# Patient Record
Sex: Female | Born: 1940 | Race: White | Smoking: Current every day smoker
Health system: Western US, Academic
[De-identification: ages and names within clinical notes are randomized; demographics above are authoritative.]

## PROBLEM LIST (undated history)

## (undated) MED ORDER — DEXAMETHASONE SOD PHOSPHATE PF 10 MG/ML IJ SOLN
1.00 mg | Freq: Once | INTRAMUSCULAR | Status: AC
Start: 2016-02-11 — End: 2016-02-11

## (undated) MED ORDER — DEXAMETHASONE SOD PHOSPHATE PF 10 MG/ML IJ SOLN
1.00 mg | Freq: Once | INTRAMUSCULAR | Status: AC
Start: 2016-02-17 — End: 2016-02-17

## (undated) MED ORDER — IOHEXOL 240 MG/ML IJ SOLN
1.00 mL | Freq: Once | INTRAMUSCULAR | Status: AC
Start: 2016-02-17 — End: 2016-02-17

## (undated) MED ORDER — IOHEXOL 240 MG/ML IJ SOLN
1.00 mL | Freq: Once | INTRAMUSCULAR | Status: AC
Start: 2016-02-11 — End: 2016-02-11

---

## 2004-01-22 ENCOUNTER — Ambulatory Visit (HOSPITAL_BASED_OUTPATIENT_CLINIC_OR_DEPARTMENT_OTHER)

## 2004-02-01 NOTE — Progress Notes (Signed)
 CLINIC: NEUROSURGERY      REPORT TYPE: CONSULT      Dictating Practitioner: Wilburt Finlay, M.D.    DATE OF SERVICE: 01/22/2004    REASON FOR VISIT: COMPREHENSIVE NEW VISIT    HISTORY OF PRESENT ILLNESS: [name] is a 64 year old woman who is seen  in initial consultation because of difficulties with her neck and low back.  The problems with the neck have been longstanding, and she has a scan from  2000 which demonstrates disk degenerative with root compression, left  greater than right at C4-C5 and C6-C7. Unfortunately, the patient was  given the wrong set of films for the 2005 study and brought with her a  study of both knees, which show osteoarthritis and some hand studies that  show minimal changes. The patient also has difficulty with pain at the  elbow, related to fibromyalgia, and has low back pain with minimal  radiation into the legs, which may be related in part to her splinting of  her neck, as she sits with her head cocked slightly to the left. The  patient's general health has been good, and she has been very active. She  stopped smoking 8 months ago and does drink minimally socially. Her weight  today at the time of visit was 149 pounds. Her blood pressure is 122/71.  She has been married on 2 occasions and changed her name. Her present name  is Melinda Gallagher, and she was accompanied by her present female companion, who  is not her husband. She graded her pain in the neck as 8 out of 10 and is  particularly bothered by numbness in both hands, perhaps slightly worse on  the left, because she is left-handed and finds that she uses that hand  more. She also notes that she is dropping things regularly.    PHYSICAL EXAMINATION:  She was alert and awake and answered all questions appropriately. The  cranial nerves are intact. Neck flexion is limited to approximately 50  degrees, extension to 15 degrees. Rotation to the right is approximately  45 to 50 degrees, to the left no more than 35 degrees. She is  tender over  the lower portion of the cervical spine. Strength in the upper extremities  is generally mildly reduced, left greater than right. There is mild  atrophy of the triceps and biceps on the left. I would grade her strength  at the deltoid at 5/5, at the biceps is 4/5 on the left and 4+/5 on the  right. The triceps is no more than 4-/5 on the left and thought to be  normal on the right. Pronation is 3+/5 on the left and normal on the  right, and supination I thought was also mildly diminished on the left, but  normal on the right. The lower extremity strength is normal. Her gait is  normal, but she does walk with her head cocked to the left. The abdomen is  minimally obese but nontender, and there is no organomegaly.    IMPRESSION AND PLAN: This woman has a number of problems, but the most  severe is cervical spondylosis. The degree of disease is probably advanced  over the last 5 years, but she will need an anterior approach, given the  fact that she has substantial pain under the shoulder blades, which is one  of her major complaints. She also has some pain between the shoulder  blades, but there were minimal changes at C5-C6, and I think most of  this  is radiating from C6-C7. The pain over the top of her shoulders is  associated with the changes at C4-C5 on the scan from 2000. I have  scheduled her for a visit with Amalia Hailey, our nurse practitioner,  and at that time she will bring her new scan. She is going to need either  a 2 or 3-level anterior procedure, and I will get that scheduled towards  the end of February 2006.      Job #: [id number]          Signature Derived From Controlled Access Password  Wilburt Finlay, M.D. 02/01/2004 07:38          DD: 01/22/2004 DT: 01/22/2004 3:20 P DocNo.: [id number]  [id number]      Primary Care Physician:  NONE     PCP_Address2      cc:

## 2004-02-02 ENCOUNTER — Other Ambulatory Visit (INDEPENDENT_AMBULATORY_CARE_PROVIDER_SITE_OTHER): Payer: Self-pay | Admitting: Neurological Surgery

## 2004-02-02 ENCOUNTER — Encounter (INDEPENDENT_AMBULATORY_CARE_PROVIDER_SITE_OTHER): Payer: Self-pay | Admitting: Family

## 2004-02-02 ENCOUNTER — Other Ambulatory Visit (INDEPENDENT_AMBULATORY_CARE_PROVIDER_SITE_OTHER): Payer: Self-pay

## 2004-02-09 NOTE — H&P (Signed)
 Dictating Practitioner: Koren Bound, N.P.        Staff Physician: Wilburt Finlay, M.D.      Date of Admission: 02/15/2004      HISTORY AND PHYSICAL    The patient is a 64 year old female. She is on SSI for her neck and back  degenerative disease. Her attending physician's name is Dr. Landry Dyke of Neurosurgery.    Chief complaint at this time is neck pain. She is preoperative for an  anterior cervical diskectomy at C4-5 and C6-7 and possibly at C5-6 with  allograft fusions and plating. The patient has a history of neck pain  which has been present for a number of years but gradually gotten worse.  It is aggravated by extension and rotation. Her rotation is limited  markedly because of neck pain. She has a history of a very heavy object  falling on her head approximately 10 years ago. She has not had any prior  neck surgeries. She also notes that her hands feel numb most of the time  with a tingling, painful type numbness and she has also developed grip  weakness as well as some mild decrease in her balance, no changes in her  bowel and bladder function. She does have a history of constipation. She  states also she was recently diagnosed with fibromyalgia.    ALLERGIES: BEE STINGS AND CODEINE; CODEINE RESULTS IN GI DISTRESS.    PRIOR SURGERIES: Childbirth times three; she had a right inguinal hernia  repair at age 32, a left wrist ganglion resected, TAH and BSO for ovarian  cancer in 1979 and a laparoscopic cholecystectomy several years ago.    PAST MEDICAL HISTORY: Significant for history of constipation, history of  gastroesophageal reflux disease, depression and asthma as well as most  recently hypothyroidism.    CURRENT MEDICATIONS: Prevacid 30 mg, 1 every morning; Singular 10 mg, 1  every evening; Lipitor 10 mg, 1 every morning; Effexor XR 37.5 mg, 1 every  morning; Dulcolax 100 mg, 1 to 2 daily for constipation; gabapentin 300 mg,  1 at bedtime; flurazepam 15 mg, 1 to 2 at bedtime;  Levoxyl 100 mcg, 1 every  morning; inhalers - Pulmicort two puffs twice a day and albuterol two puffs  twice a day.    TRANSFUSION HISTORY: Negative.    SOCIAL HISTORY: Married, has three children, one child with diabetes  mellitus. She follows a regular diet, drinks one glass of wine daily, she  quit smoking nine months ago.    FAMILY HISTORY: Mother died of emphysema. Her father died of  alcohol-related complications.    SYSTEM REVIEW: Patient has no known history of cardiac disease or MI. She  does have a history of asthma which is controlled at the present time; she  has had no recent respiratory infections. GI: Significant for a history  of ulcer in her 20s, no recent abdominal pain or melena. She had a  screening colonoscopy several years ago that was negative. She has no  known history of renal disease, no history of hematologic disorders, no  history of diabetes mellitus. She was recently diagnosed as having  hypothyroidism and was started on Synthroid. Peripheral Vascular: No  history of DVT. Musculoskeletal: She has osteoarthritis of her knees.  Neurologic: Brief episode of loss of vision in the 1980s, unclear  etiology. She has a history of depression. Reproductive: See the past  surgical history.    PHYSICAL EXAMINATION: Vital Signs: Pulse is 74 and regular, respiratory  rate 14, height is 5 feet, 3 inches, weight is 150 pounds. General: The  patient is a well-developed, well-nourished female who is able to ambulate  without assistance. Skin: No rashes. HEENT: Pupils are equal, round and  reactive to light. Her pharynx is clear. Neck: Decreased range of motion  secondary to pain. Thyroid: No masses. No cervical lymphadenopathy.  Chest and Lungs: Clear without wheezes, rales or rhonchi. Heart: Her  heart rate is regular without murmurs, no S3 or S4. Peripheral Vascular:  Within normal limits. I did not hear carotid bruits. She has palpable  pulses throughout. Abdomen: Soft, no  hepatosplenomegaly.  Musculoskeletal: No peripheral clubbing, cyanosis or edema, crepitus  around the knees with range of motion. Neurologic: Significant for mild  upper extremity weakness at the deltoids, 5- over 5, and grip strength 5-  over 5 and decreased sensation to light touch in the hands and fingers  bilaterally. Her DTRs are slightly increased at the knees but normal at  the ankles and her toes are downgoing. Genital and Pelvic: Has been done  by her primary care physician.    INITIAL LABORATORY STUDIES: Will obtain a CBC, PT/PTT, chem-7 and TSH.  Her EKG shows normal sinus rhythm.    ASSESSMENT AND PLAN: The patient understands to avoid aspirin and  nonsteroidal anti-inflammatories prior to surgery. She will bring her MRI  with her to surgery. Surgical consents are signed. No blood is  anticipated for her surgery.                      Reviewed & Electronically Signed  Koren Bound, N.P. 02/05/2004 16:32  Signature Derived From Controlled Access Password  Wilburt Finlay, M.D. 02/09/2004 09:50    DD: 02/02/2004 DT: 02/02/2004 2:58 P DocNo.: [id number]  [id number]        cc:

## 2004-02-11 NOTE — ECG/Echo (Signed)
Normal sinus rhythm  Normal ECG  No previous tracing on file for serial comparison

## 2004-02-12 ENCOUNTER — Other Ambulatory Visit: Payer: Self-pay

## 2004-02-15 ENCOUNTER — Other Ambulatory Visit (INDEPENDENT_AMBULATORY_CARE_PROVIDER_SITE_OTHER): Payer: Self-pay | Admitting: Neurological Surgery

## 2004-02-15 ENCOUNTER — Inpatient Hospital Stay (HOSPITAL_COMMUNITY): Admitting: Neurological Surgery

## 2004-02-15 LAB — APTT, BLOOD
PTT, Control: 26.2 s
PTT, Control: 26.2 s
PTT: 25.1 s (ref 25.0–33.0)
PTT: 23.4 s — ABNORMAL LOW (ref 25.0–33.0)

## 2004-02-15 LAB — COMPREHENSIVE METABOLIC PANEL, BLOOD
ALT (SGPT): 108 IU/L — ABNORMAL HIGH (ref 10–45)
ALT (SGPT): 220 IU/L — ABNORMAL HIGH (ref 10–45)
AST (SGOT): 251 IU/L — ABNORMAL HIGH (ref 10–45)
AST (SGOT): 332 IU/L — ABNORMAL HIGH (ref 10–45)
Albumin: 3.2 gm/dL — ABNORMAL LOW (ref 3.3–5.0)
Albumin: 3.4 gm/dL (ref 3.3–5.0)
Alkaline Phos: 123 IU/L (ref 30–130)
Alkaline Phos: 153 IU/L — ABNORMAL HIGH (ref 30–130)
BUN: 16 mg/dL (ref 8–18)
BUN: 14 mg/dL (ref 8–18)
Bicarbonate: 26 mEq/L (ref 24–31)
Bicarbonate: 24 mEq/L (ref 24–31)
Bilirubin, Tot: 0.7 mg/dL (ref <1.2)
Bilirubin, Tot: 0.9 mg/dL (ref <1.2)
Calcium: 8.6 mg/dL — ABNORMAL LOW (ref 8.8–10.3)
Calcium: 8.6 mg/dL — ABNORMAL LOW (ref 8.8–10.3)
Chloride: 102 mEq/L (ref 97–107)
Chloride: 102 mEq/L (ref 97–107)
Creatinine: 0.8 mg/dL (ref 0.5–1.5)
Creatinine: 0.8 mg/dL (ref 0.5–1.5)
Glucose: 149 mg/dL — ABNORMAL HIGH (ref 65–110)
Glucose: 178 mg/dL — ABNORMAL HIGH (ref 65–110)
Potassium: 4.1 mEq/L (ref 3.5–5.0)
Potassium: 4.6 mEq/L (ref 3.5–5.0)
Sodium: 137 mEq/L (ref 135–145)
Sodium: 137 mEq/L (ref 135–145)
Total Protein: 5.5 gm/dL — ABNORMAL LOW (ref 6.0–8.0)
Total Protein: 5.9 gm/dL — ABNORMAL LOW (ref 6.0–8.0)

## 2004-02-15 LAB — PROTHROMBIN TIME, BLOOD
INR: 1
INR: 1
Protime, Control: 9.7 s
Protime, Control: 9.7 s
Protime: 10.1 s (ref 9–12)
Protime: 10 s (ref 9–12)

## 2004-02-15 LAB — HEMOGRAM, BLOOD
Hct: 29.3 % — ABNORMAL LOW (ref 36.0–46.0)
Hgb: 10.5 gm/dL — ABNORMAL LOW (ref 12.0–16.0)
MCH: 32.1 pg — ABNORMAL HIGH (ref 27–31)
MCHC: 36 % (ref 32–37)
MCV: 89.3 um3 (ref 82.0–98.0)
MPV: 7.3 fl — ABNORMAL LOW (ref 7.4–10.4)
Plt Count: 288 10*3/uL (ref 130–400)
RBC: 3.29 10*6/uL — ABNORMAL LOW (ref 4.00–5.00)
RDW: 12.7 % (ref 10–15)
WBC: 10.1 10*3/uL (ref 4.0–11.0)

## 2004-02-15 LAB — MAG/PHOS, BLOOD
Magnesium: 1.8 mg/dL (ref 1.8–2.5)
Phosphorous: 5.2 mg/dL — ABNORMAL HIGH (ref 2.5–4.5)

## 2004-02-16 NOTE — Interdisciplinary (Signed)
PERFORMED ON - 02/16/2004 08:45:00;   DONE BY - BAZMJOW, JAMILA;  PROCEDURE - AERO TRT-MDI/SPACER;   PROTOCOL DRIVEN: PENDING;   MDI-LEARNING NEED: CORRECT USE OF MDI/SPACER/PF;   MEDICATION #1: ALBUTEROL;   MED #1 AMOUNT: 2.0;   MED #1 UNIT: PUFFS;   MEDICATION #2: PULMICORT;   MED #2 AMOUNT: 2.0;   MED #2 UNIT: PUFFS;   REASON NOT STARTED: PT SELF-ADMINISTERED;   ADVERSE REACTIONS: NONE;

## 2004-02-16 NOTE — Interdisciplinary (Signed)
PERFORMED ON - 02/16/2004 09:00:00;   DONE BY - BAZMJOW, JAMILA;  PROCEDURE - PDP EVALUATION;   ORDERING PHYSICIAN: BERTA;   ADMITTING DIAGNOSIS: CERVICAL SPONDYLOSIS;   MD REQUEST #1: MDI;   REASON NOT STARTED: {SEE COMMENTS};   ADVERSE REACTIONS: NONE;   COMMENT: MD/NEUROLOGY AGAIN ATT.  COMMENT: APPROPRIATE ORDERS TO PLACE PT INTO MDI PROTOCOL.WILL ATTEMPT TO   PAGECOMMENT: UNABLE TO COMPLETE INITIAL PDP DUE TO MD NOT RESPONDING AND   WRITING

## 2004-02-16 NOTE — Interdisciplinary (Signed)
PERFORMED ON - 02/16/2004 08:50:00;   DONE BY - BAZMJOW, JAMILA;  PROCEDURE - OXYGEN-LOW FLOW;   PROTOCOL DRIVEN: PENDING;   NEURO STATUS: AWAKE;   O2 DEVICE: CANNULA-NASAL;   LITERS/MIN: 3;   SKIN COLOR: NORMAL;   SATURATION: 93;   O2 LOW-ACTIV OUTCME: CONTINUE PRESENT REGIMEN;   ADVERSE REACTIONS: NONE;

## 2004-02-16 NOTE — Interdisciplinary (Signed)
PERFORMED ON - 02/16/2004 14:45:00;   DONE BY - BAZMJOW, JAMILA;  PROCEDURE - PDP EVALUATION;   ORDERING PHYSICIAN: BERTA;   ADMITTING DIAGNOSIS: CERVICAL SPONDYLOSIS;   NEURO STATUS: AWAKE;   CODE STATUS CHECKED: YES;   HEARTRATE: 92;   RESP RATE: 17;   CLUBBING PRESENT: NO;   TRACHEOSTOMY PRESENT: NO;   SOB: NO;   BREATH SOUNDS: DIM BS BILAT;   CHEST EXCURSION: SYMMETRICAL;   COUGH EFFORT: DRY UNPRODUCTIVE;   SPUTUM PRODUCTION: NONE;   PRE-TITR FIO2 OR L/M: 2;   PRE-TITR SAO2: 88;   TITRATED FIO2 OR L/M: 3;   POST TITRATION SAO2: 92;   MD REQUEST #1: MDI;   RCP RECOMMENDATION: #1: MDI PT SELF ADM/TRANSFER TO RN SERVICES;   INDICATION: #1: B:TO INCREASED AERATION;   EVALUATION OUTCOME: #1: AS RCP RECOMMENDED;   MD REQUEST #2: O2 PROT;   RCP RECOMMENDATION: #2: O2 PROTOCOL;   INDICATION: #2: O: TO KEEP SA02 >92;   EVALUATION OUTCOME: #2: AS RCP RECOMMENDED;   ADVERSE REACTIONS: NONE;

## 2004-02-17 NOTE — Interdisciplinary (Signed)
PERFORMED ON - 02/17/2004 08:30:00;   DONE BY - RHODES, SANDY;  PROCEDURE - OXYGEN-LOW FLOW;   PROTOCOL DRIVEN: YES-RCP INITIATED;   NEURO STATUS: ALERT AND ORIENTED;   ROOM AIR SAT: 94;   O2 LOW-ACTIV OUTCME: CONTINUE PRESENT REGIMEN;   ADVERSE REACTIONS: NONE;

## 2004-02-17 NOTE — Interdisciplinary (Signed)
PERFORMED ON - 02/17/2004 08:58:00;   DONE BY - RHODES, SANDY;  PROCEDURE - PDP RE-EVALUATION;   RESP DIAGNOSIS: EMPHYSEMA;   ADMITTING DIAGNOSIS: CERVICAL SPONDYLOSIS, ANTERIOR CERVICAL ;   NEURO STATUS: ALERT AND ORIENTED;   CODE STATUS: FULL CODE/FULL CARE;   HEARTRATE: 82;   RESP RATE: 14;   HEMOGLOBIN: 10.5;   CLUBBING PRESENT: NO;   TRACHEOSTOMY PRESENT: NO;   SOB: NO;   BREATH SOUNDS: DIM BILAT;   CHEST EXCURSION: SYMMETRICAL;   COUGH EFFORT: FAIR;   SPUTUM PRODUCTION: 1 TO ;   SPUTUM COLOR: BROWN;   SPUTUM CONSISTANCY: THICK;   PRE-TITR FIO2 OR L/M: 0.21;   PRE-TITR SAO2: 94;   MD REQUEST #1: O2;   RCP RECOMMENDATION: #1: O2 PROTOCOL;   INDICATION: #1: O: TO KEEP SA02 >92;   EVALUATION OUTCOME: #1: AS RCP RECOMMENDED;   ADVERSE REACTIONS: NONE;

## 2004-02-24 NOTE — Lab (Signed)
 FINAL PATHOLOGIC DIAGNOSIS:  A: Disc 4-5, 5-6, 6-7   -Intervertebral disc material.     COMMENT:  H&E stained sections of the entirely submitted specimen show abundant  intervertebral disc material with minute foci of edge neovascularization.  SPECIMEN(S) SUBMITTED: A: disc 4-5, 5-6, 6-7  CLINICAL HISTORY: Cervical spondylosis.  GROSS DESCRIPTION: A: The specimen (received in formalin, labeled with the  patient's name, medical record number and "02/15/04, OR7, 1, disc 4-5, 5-6,  6-7") consists of numerous gray and tan irregularly shaped soft tissue  measuring approximately 1.3 x 1.0 x 0.4 cm. The entire specimen is  submitted in cassette A1 for formalin fixed, paraffin embedded sections.  ZP/jb  CONFIDENTIAL HEALTH INFORMATION: Health Care information is personal and  sensitive information. If it is being faxed to you it is done so under  appropriate authorization from the patient or under circumstances that do  not require patient authorization. You, the recipient, are obligated to  maintain it in a safe, secure and confidential manner. Re-disclosure  without additional patient consent or as permitted by law is prohibited.  Unauthorized re-disclosure or failure to maintain confidentiality could  subject you to penalties described in federal and state law. If you have  received this report or facsimile in error, please notify the [facility] immediately and destroy the received document(s).   Material reviewed and Interpreted and  Report Electronically Signed by:  Regis Bill. Hansen M.D. [phone])  Attending Neuropathologist  02/24/04 17:44  Electronic Signature derived from a single  controlled access password

## 2004-02-25 NOTE — Op Note (Signed)
 Dictating Practitioner: Wilburt Finlay, M.D.    Staff Physician: Wilburt Finlay, M.D.    Date of Operation: 02/15/2004          PREOPERATIVE DIAGNOSIS: Cervical spondylosis with cervical stenosis from  C4-5 to C6-7.  POSTOPERATIVE DIAGNOSIS: Cervical spondylosis with cervical stenosis from  C4-5 to C6-7.  OPERATIONS: 1. Anterior cervical diskectomies at C4-5 and C5-6 using  operating microscope. 2. Partial vertebrectomy of C6 and diskectomy at  C6-7 using operating microscope. 3. Placement of allograft fibular  fusion, 8-mm graft at C6-7, 7-mm graft at C5-6 and 6-mm graft at C4-5. 4.  Placement of 51-mm, low-profile, eight-hole plate using four 14-mm screws  at top two levels and 16-mm self-drilling, self-tapping screws at bottom  two levels with locking screws.  ATTENDING SURGEON: Estill Batten ASST: S. Berta    ESTIMATED BLOOD LOSS: 25 mL.  DRAINS LEFT: One.    NOTE: I performed the procedure with Dr. Verdene Lennert assistance, who was  present throughout.    OPERATIVE SUMMARY: This woman has cervical spondylosis with both neck pain  as well as arm pain and pain about the shoulders. Accordingly, after an  MRI scan demonstrated compression at multiple levels and cervical stenosis,  she was brought to the operating room and intubated asleep, but with a  fiberoptic technique. The neck was placed in minimal extension. The field  was prepared and draped in the usual fashion.    A longitudinal incision was made extending from the bottom of C3 down to  C7. Dissection was carried medial to the carotid sheath and  sternocleidomastoid muscle. We identified some large veins, which we were  able to retract out of the way, and then opened the prevertebral soft  tissue fascia after using a Kitner to get better exposure. We then pushed  the longus colli muscles laterally using a key elevator and then the Bovie.  A needle was placed in C4-5, which confirmed the level. We then completed  exposing C4 through C7 and placed two  sets of Trimline retractor blades.    Then, using the Midas Rex drill, which we used to remove parts of the  overlying lips of C4, C5 and C6, we then used a blade to excise a portion  of the disk, but there was not very much disk material at any of the three  levels. We then brought in the operating microscope and began at C6-7,  where we drilled out a significant amount of bony spurring as well as some  disk material at the bottom. The posterior longitudinal ligament was  exposed, opened using the [employer] and then [employer] out using 1- and  2-mm Kerrisons. Bleeding was controlled with a small bit of Surgicel along  the root on the right side at C6-7.    We then placed Caspar pins to get better distraction at C5-6. We drilled  this out under the microscope as well. Here, the posterior longitudinal  ligament was again opened using the eBay. We carried the dissection  laterally using 1- and 2-mm Kerrisons. Bleeding was controlled with the  bipolar. A 7-mm graft was then placed under distraction. We then went  back to the C6-7 level, having gotten C5-6 decompressed, and using a drill,  enlarged the space considerably to get exposure of the remaining osteophyte  at this level, which was then drilled out. Thus, a partial vertebrectomy  had to be carried out.    An 8-mm lordotic graft was then tapped into  place under distraction. We  then placed distraction pins at C4, having had a pin placed at C5,  distracted this disk space and under the microscope, cleaned this out as  well using the Midas Rex drill, a small angled-up curet and then the Rosen  knife to open the posterior longitudinal ligament, which was rongeured away  using a 1-mm Kerrison. Bleeding was controlled with the bipolar and was  minimal. A 6-mm graft was then tapped into place here without difficulty.  The pins were then all removed and the holes waxed. A Penrose drain was  placed through a separate stab wound.    A 51-mm plate was then  selected and placed over C4 through C7. It was bent  minimally at the bottom end in order to get a good fixation. Then, using  the awl, we placed successive holes at each of the levels. We used four  14-mm screws above and four 16-mm screws below. Locking screws were then  placed. The wound was copiously irrigated. Bleeding was controlled with  the bipolar. The wound was then closed in layers with Dexon and Monocryl  suture on the skin along with tissue glue.                          Signature Derived From Controlled Access Password  Wilburt Finlay, M.D. 02/25/2004 17:54    DD: 02/15/2004 DT: 02/16/2004 3:02 P DocNo.: [id number]  [id number].[facility]    Referring Physician:  Loreli Slot        Primary Care Physician:  Loreli Slot        cc:

## 2004-02-27 NOTE — Op Note (Signed)
 Dictating Practitioner: Verna Czech. Berton Lan, M.D.    Staff Physician: Wilburt Finlay, M.D.    Date of Operation: 02/15/2004          PREOPERATIVE DIAGNOSIS: Cervical spinal stenosis with degenerative disk  disease.  POSTOPERATIVE DIAGNOSIS: Cervical spinal stenosis with degenerative disc  disease.  OPERATION: Anterior cervical diskectomy and fusion using Synthes plating  system and screws with allograft at C4-5, C5-6 and C6-7 levels.  SURGEON/STAFF: Estill Batten ASSTVerdene Lennert    ANESTHESIA: General endotracheal tube anesthesia.    DETAILS OF CONSENT: The patient was made aware of all risks and benefits  of the procedure and all questions were answered to her satisfaction.    DETAILS OF THE PROCEDURE: The patient was brought back to the operating  room, intubated and sedated. One gram of Ancef was given. The patient was  placed in a supine fashion with a bump placed beneath her shoulder blades.  The patient's left side of the neck was prepped and draped in a surgical  fashion.    An approximately 4 to 5 mm incision was made in a longitudinal direction  anterior to the sternocleidomastoid muscle on the left side. This was done  using a #10 blade scalpel after administration of local anesthetic.  Further tissue dissection was accomplished using Bovie electrocautery over  the superficial layers. Further tissue dissection was then accomplished  using Metzenbaum scissors. Key structures were identified, such as the  carotid sheath the esophagus and trachea, and were avoided. A plane was  dissected longitudinally, anterior to the sternocleidomastoid muscle, down  to the level of the cervical spine. The anterior aspect of the vertebral  bodies was identified and the anterior longitudinal ligament was dissected  to allow the Bovie to be used under the longus coli muscles to sweep down  laterally in order to facilitate placement of self-retaining retractor  system.    A localizing needle was placed at this time and an  intraoperative lateral  cervical spine x-ray was obtained to confirm proper level. The needle was  demonstrated to be in the cervical 4-5 region interdisk space. Next, the  operating microscope was brought in, in order to further define the  microanatomy of the structures unable to be identified using the naked eye  loupe magnification. A #11 blade scalpel was used to box-cut out the disk  material from C4-5, C5-6 and C6-7. Caspar pins were placed into the  vertebral bodies in order to allow for distraction. Further disk material  was removed using a combination of curets as well as Kerrison rongeur, in  addition to the Midas Rex drill. Each disk space was cleared down to the  level of the posterior longitudinal ligament, at which time a Rosen knife  was used to define a plane and a combination of #1 and #2 Kerrisons was  used to transect the posterior longitudinal ligament out to the level of  the foramina, through which bilateral foraminotomies were also performed at  all three levels.    Once it was felt that adequate decompression was achieved, allograft was  then placed into the three intervertebral spaces after trialing devices  were used. The allograft was placed into C4-5, C5-6 and C6-7 and were  tamped in place using a mallet and tamping device. At this time an  anterior cervical plate, which was the Synthes plate, was used to cover  from the C4 vertebral body down to the C7 vertebral body. Then 14 mm  screws were placed  in the C4 vertebral body bilaterally as well as the C5  vertebral body bilaterally, and then 16 mm screws were placed at the C6  vertebral body and the C7 vertebral body bilaterally at converging angles  to increase the pull-out strength. Locking screws were then put in place  in all of the screws.    Meticulous hemostasis was achieved and copious irrigation was used. A  Penrose drain was placed and the incision was closed using a running 3-0  Vicryl for the platysma followed by inverted  interrupted 3-0 Vicryl sutures  for the dermal and subdermal layer, followed by a running 4-0 Monocryl for  final skin closure. Indermil was placed on top of the incision for final  skin closure.    The sponge, instrument and needle counts were correct at the end of the  case. Dr. Landry Dyke was scrubbed for the entire operative  procedure. After the procedure was completed, the vocal cords were  assessed using the fiberoptic device for symmetry and were found to be  consistent with normal anatomy.                      Reviewed & Electronically Signed  Vallen Calabrese C. Berton Lan, M.D. 02/24/2004 14:53  Signature Derived From Controlled Access Password  Wilburt Finlay, M.D. 02/27/2004 06:03    DD: 02/15/2004 DT: 02/16/2004 5:22 P DocNo.: [id number]  SCB/r10 [id number].[facility]    Referring Physician:  Loreli Slot        Primary Care Physician:  Loreli Slot        cc:
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
## 2004-03-01 ENCOUNTER — Ambulatory Visit (INDEPENDENT_AMBULATORY_CARE_PROVIDER_SITE_OTHER): Admitting: Family

## 2004-03-01 ENCOUNTER — Other Ambulatory Visit (INDEPENDENT_AMBULATORY_CARE_PROVIDER_SITE_OTHER): Payer: Self-pay | Admitting: Family

## 2004-03-22 ENCOUNTER — Ambulatory Visit (INDEPENDENT_AMBULATORY_CARE_PROVIDER_SITE_OTHER): Admitting: Neurological Surgery

## 2004-05-03 ENCOUNTER — Ambulatory Visit (INDEPENDENT_AMBULATORY_CARE_PROVIDER_SITE_OTHER): Admitting: Neurological Surgery

## 2004-05-03 ENCOUNTER — Encounter (INDEPENDENT_AMBULATORY_CARE_PROVIDER_SITE_OTHER): Admitting: Neurological Surgery

## 2004-06-14 ENCOUNTER — Encounter (INDEPENDENT_AMBULATORY_CARE_PROVIDER_SITE_OTHER): Admitting: Neurological Surgery

## 2004-06-21 ENCOUNTER — Ambulatory Visit (INDEPENDENT_AMBULATORY_CARE_PROVIDER_SITE_OTHER): Admitting: Neurological Surgery

## 2004-06-23 NOTE — Progress Notes (Signed)
CLINIC: NEUROSURGERY      REPORT TYPE: NOTE      Dictating Practitioner: Wilburt Finlay, M.D.      DATE OF SERVICE: 06/21/2004    REASON FOR VISIT: FOLLOWUP    HISTORY: Ms. Goldwasser returns today for her last follow-up visit. She has  had a very nice course with regard to her 3-level anterior cervical  discectomy. Everything is healed and the pain in her left arm is  completely gone. She has some mild limitation of rotation of the neck but  other than that is doing very well. Unfortunately, she has had an episode  of severe lumbar discomfort, primarily on the right side into the buttock  and perhaps some into the leg, which lasted about 2 weeks and from which  she was completely disabled during that time. She is now substantially  better but it is getting very hot in the [facility], so her activities  are somewhat limited. I have told her that next time should she have such  an episode, she should feel free to call me and I will see what I can do  via telephone.      Job #: [id number]          Signature Derived From Controlled Access Password  Wilburt Finlay, M.D. 06/23/2004 09:27      DD: 06/21/2004 DT: 06/21/2004 11:00 A DocNo.: [id number]  [id number]      Primary Care Physician:  UNKNOWN        cc:

## 2016-02-11 ENCOUNTER — Encounter (INDEPENDENT_AMBULATORY_CARE_PROVIDER_SITE_OTHER): Payer: Self-pay | Admitting: Interventional Pain Management

## 2016-02-11 ENCOUNTER — Ambulatory Visit (INDEPENDENT_AMBULATORY_CARE_PROVIDER_SITE_OTHER): Payer: Medicare Other | Admitting: Interventional Pain Management

## 2016-02-11 VITALS — BP 109/65 | HR 69 | Temp 98.3°F | Resp 16

## 2016-02-11 DIAGNOSIS — M542 Cervicalgia: Principal | ICD-10-CM

## 2016-02-11 DIAGNOSIS — M961 Postlaminectomy syndrome, not elsewhere classified: Secondary | ICD-10-CM

## 2016-02-11 DIAGNOSIS — M5416 Radiculopathy, lumbar region: Secondary | ICD-10-CM

## 2016-02-11 DIAGNOSIS — G8929 Other chronic pain: Principal | ICD-10-CM
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
## 2016-02-15 ENCOUNTER — Ambulatory Visit (HOSPITAL_BASED_OUTPATIENT_CLINIC_OR_DEPARTMENT_OTHER): Payer: Medicare Other | Admitting: Physical Medicine & Rehabilitation

## 2016-02-17 ENCOUNTER — Ambulatory Visit
Admission: RE | Admit: 2016-02-17 | Discharge: 2016-02-17 | Disposition: A | Discharge status: Home / Self Care | Payer: Medicare Other | Attending: Interventional Pain Management | Admitting: Interventional Pain Management

## 2016-02-17 ENCOUNTER — Other Ambulatory Visit (HOSPITAL_BASED_OUTPATIENT_CLINIC_OR_DEPARTMENT_OTHER): Payer: Self-pay | Admitting: Interventional Pain Management

## 2016-02-17 DIAGNOSIS — M5416 Radiculopathy, lumbar region: Principal | ICD-10-CM | POA: Insufficient documentation

## 2016-02-17 DIAGNOSIS — R52 Pain, unspecified: Principal | ICD-10-CM | POA: Insufficient documentation

## 2016-02-17 MED ORDER — IOHEXOL 240 MG/ML IJ SOLN
1.0000 mL | Freq: Once | INTRAMUSCULAR | Status: AC
Start: 2016-02-17 — End: 2016-02-17
  Administered 2016-02-17: 240 mg via EPIDURAL
  Filled 2016-02-17 (×2): qty 10

## 2016-02-17 MED ORDER — DEXAMETHASONE SOD PHOSPHATE PF 10 MG/ML IJ SOLN
1.0000 mg | Freq: Once | INTRAMUSCULAR | Status: AC
Start: 2016-02-17 — End: 2016-02-17
  Administered 2016-02-17: 5 mg via EPIDURAL
  Filled 2016-02-17 (×2): qty 1

## 2016-02-17 NOTE — H&P (Signed)
Ambulatory Surgery/Invasive Procedure History and Physical      Primary Care Physician Mariella Saahiermann, Paige Alexandra    Chief Complaint:  low back and leg pain     76 year old female     BP 132/65 (BP Location: Right arm, BP Patient Position: Sitting)   Pulse 75   Temp 98 F (36.7 C) (Oral)   Resp 16   Ht 5\' 3"  (1.6 m)   Wt 53.5 kg (118 lb)   SpO2 97%   BMI 20.9 kg/m2    No past medical history on file.    Allergies   Allergen Reactions    Bee Venom Hives    Codeine Other     Patient has a ulcer        No current outpatient prescriptions on file.     Current Facility-Administered Medications   Medication Dose Route Frequency Provider Last Rate Last Dose    [COMPLETED] dexamethasone (DECADRON) PF injection 1-10 mg  1-10 mg Epidural Once Deboraha Sprangeddy, Rajiv Dwaram, MD   5 mg at 02/17/16 1415    [COMPLETED] iohexol (OMNIPAQUE 240) 240 MG/ML injection 240-2,400 mg  1-10 mL Epidural Once Deboraha Sprangeddy, Rajiv Dwaram, MD   240 mg at 02/17/16 1415       I have reviewed the patient's medications.      Physical Exam       Can this patient make their own healthcare decisions?  Yes    Chest:  Breaths easily     Heart:  RRR    Abdomen:  Soft    Pain Management Needs/Options discussed.    No Advanced Directives.      Resuscitative Status:  Full Code, Full Care    Diagnosis:    ICD-10-CM ICD-9-CM    1. Lumbar radiculopathy M54.16 724.4 dexamethasone (DECADRON) PF injection 1-10 mg      iohexol (OMNIPAQUE 240) 240 MG/ML injection 240-2,400 mg       Procedure: Lumbar epidural steroid injection    The patient has had extended relief with this procedure at the same levels of greater than 50% for greater than 3 months.      Discussed Risks, Benefits, and Alternatives to procedure.  Questions answered.  Patient voiced understanding and wished to proceed. Consent Signed.    See procedure note of same date.

## 2016-02-17 NOTE — Discharge Instructions (Signed)
Patient verbalized understanding of below instructions    Procedure Done Today: Lumbar Epidural Steroid Injection    Follow up with another injection in 2-3 weeks.    Post Procedure Instructions  Continue present medication unless otherwise indicated.  Resume your normal diet after being discharge.  Ice pack to treatment site (no more than 20 minutes at a time) if needed for pain relief and/or muscle spasm.  Avoid strenuous activities and driving until tomorrow morning.  If you have a band-aid dressing, you may remove it tomorrow morning.  Resume normal activities tomorrow morning, unless otherwise directed.  No submersion in water for 24 hrs.  If your next appointment is at the Pain Clinic, then please call [phone] to make an appointment.  If your next appointment is another procedure, then please call [phone] to make an appointment.    Call the [facility],  [phone] and ask for the pain management provider on call for ANY sign of:  Temperature above 101.5 F  Redness or drainage at the treatment site  Severe, uncontrollable headache    Call 911 IN CASE OF EMERGENCY

## 2016-02-17 NOTE — Progress Notes (Signed)
Attending Note:    Subjective:  I reviewed the history.  Patient interviewed and examined.  History of present illness (HPI):  Pain     Review of Systems (ROS): As per the fellow's note.  Past Medical, Family, Social History:  As per the fellow's  note.    Objective:   I have examined the patient and I concur with the fellow's exam.  Assessment and plan reviewed with the fellow and patient. I agree with the fellow's plan as documented and have edited fellow's note as necessary.  See the fellow's note for further details.

## 2016-02-17 NOTE — Procedures (Signed)
Procedure Note, [facility]    Preoperative Diagnosis: Lumbar radiculopathy    Postoperative Diagnosis: Lumbar radiculopathy    Procedure: 1. L5-S1 interlaminar lumbar  epidural steroid injection    2.   Fluoroscopy for needle guidance.  3.  Lumbar epidurography.     Surgeon/Staff: Tollie Pizzaimothy J Ermalinda Joubert, MD    Anesthesia:  Lidocaine 1%  Fluoroscopy Time:   16     seconds                                         Indications: The patient c/o  Axial Low Back Pain and Lower Extremity Pain. The patient's history and physical findings are consistant with Lumbar radiculopathy. They are here for an injection at the site thought to be the source of their pain. The patient has benefited from similar injections in the past.    Procedure in detail:  Written informed consent was obtained.  The chart was reviewed, questions were answered and the patient wished to proceed. The patient had no contraindications to the procedure.  Vital signs were stable. Standard monitoring was applied.  The patient, the procedure nurse, and the physician confirmed the site of injection after an official "time out." The skin over the injection site was also marked and confirmed as the site of the injection.    Localization Time Out:  An additional intraoperative timeout, specifically to confirm accurate localization, was conducted by the attending physician.     The patient remained awake and alert throughout the procedure.  The patient was placed in the prone position.  The skin was prepped with chlorhexidine and sterile drapes were applied. The skin and soft tissues were anesthetized with Lidocaine 1%. A  20 gauge 3.5 inch Touhy needle epidural needle was inserted percutaneously and advanced under fluoroscopic guidance using  AP, and lateral projections. Using loss of resistance to preservative free normal saline and a left sided interlaminar approach, the L5-S1 posterior epidural space was entered after the lamina was contacted with the  epidural needle.  No paraesthesias occurred and aspiration was negative   Epidurography and radiographic interpretation: Epidurography was performed by injection of Omnipaque 240 under live fluoroscopy.  Multiple Xray images were obtained.  Radiologic examination confirmed proper spread of the contrast medium within the epidural space.  There was no evidence of intrathecal or intravascular runoff. AP images obtained and Oblique images obtained..    The needle was injected with dexamethasone (10 mg/mL)  5 mg, Preservative free normal saline 2 mL and Lidocaine (1%)  0.5 mL.  The needle was removed.  A sterile bandage was applied.  The patient did not experience any hemodynamic or neurologic sequelae.  The attending physician was present during all critical points of the procedure.    The patient was transferred to the recovery area. Post operative instructions were explained and given to the  patient. The patient was discharged in stable health and given a procedure clinic appointment.

## 2016-03-08 ENCOUNTER — Other Ambulatory Visit (HOSPITAL_BASED_OUTPATIENT_CLINIC_OR_DEPARTMENT_OTHER): Payer: Self-pay | Admitting: Interventional Pain Management

## 2016-03-08 ENCOUNTER — Ambulatory Visit
Admission: RE | Admit: 2016-03-08 | Discharge: 2016-03-08 | Disposition: A | Discharge status: Home / Self Care | Payer: Medicare Other | Attending: Interventional Pain Management | Admitting: Interventional Pain Management

## 2016-03-08 DIAGNOSIS — M542 Cervicalgia: Secondary | ICD-10-CM

## 2016-03-08 DIAGNOSIS — M545 Low back pain: Secondary | ICD-10-CM | POA: Insufficient documentation

## 2016-03-08 DIAGNOSIS — R52 Pain, unspecified: Principal | ICD-10-CM | POA: Insufficient documentation

## 2016-03-08 DIAGNOSIS — M5416 Radiculopathy, lumbar region: Principal | ICD-10-CM | POA: Insufficient documentation

## 2016-03-08 DIAGNOSIS — G8929 Other chronic pain: Secondary | ICD-10-CM

## 2016-03-08 MED ORDER — IOHEXOL 240 MG/ML IJ SOLN
1.00 mL | Freq: Once | INTRAMUSCULAR | Status: AC
Start: 2016-03-08 — End: 2016-03-08
  Administered 2016-03-08: 240 mg via EPIDURAL
  Filled 2016-03-08 (×2): qty 10

## 2016-03-08 MED ORDER — DEXAMETHASONE SOD PHOSPHATE PF 10 MG/ML IJ SOLN
1.00 mg | Freq: Once | INTRAMUSCULAR | Status: AC
Start: 2016-03-08 — End: 2016-03-08
  Administered 2016-03-08: 5 mg via EPIDURAL
  Filled 2016-03-08 (×2): qty 1

## 2016-03-08 NOTE — H&P (Signed)
Ambulatory Surgery/Invasive Procedure History and Physical      Primary Care Physician Mariella Saahiermann, Paige Alexandra    Chief Complaint:  low back and leg pain     76 year old female     BP 136/85 (BP Location: Right arm, BP Patient Position: Sitting)   Pulse 72   Temp 97.8 F (36.6 C) (Oral)   Resp 16   SpO2 95%    No past medical history on file.    Allergies   Allergen Reactions    Bee Venom Hives    Codeine Other     Patient has a ulcer        No current outpatient prescriptions on file.     Current Facility-Administered Medications   Medication Dose Route Frequency Provider Last Rate Last Dose    dexamethasone (DECADRON) PF injection 1-10 mg  1-10 mg Epidural Once Daelen Belvedere, Tollie Pizzaimothy J, MD        iohexol (OMNIPAQUE 240) 240 MG/ML injection 240-2,400 mg  1-10 mL Epidural Once Ifeanyi Mickelson, Tollie Pizzaimothy J, MD           I have reviewed the past medical history, allergies and current medications as documented in the electronic health record.      Physical Exam       Can this patient make their own healthcare decisions?  Yes    Chest:  Breaths easily     Heart:  RRR    Abdomen:  Soft    Pain Management Needs/Options discussed.    No Advanced Directives.      Resuscitative Status:  Full Code, Full Care    Diagnosis:    ICD-10-CM ICD-9-CM    1. Lumbar radiculopathy M54.16 724.4 dexamethasone (DECADRON) PF injection 1-10 mg      iohexol (OMNIPAQUE 240) 240 MG/ML injection 240-2,400 mg       Procedure: Lumbar epidural steroid injection    The patient has had extended relief with this procedure at the same levels of greater than 50% for greater than 6 weeks.      Discussed Risks, Benefits, and Alternatives to procedure.  Questions answered.  Patient voiced understanding and wished to proceed. Consent Signed.    See procedure note of same date.

## 2016-03-08 NOTE — Procedures (Signed)
Procedure Note, [facility]    Preoperative Diagnosis: Lumbar radiculopathy    Postoperative Diagnosis: Lumbar radiculopathy    Procedure: 1. L5-S1 interlaminar lumbar  epidural steroid injection    2.   Fluoroscopy for needle guidance.  3.  Lumbar epidurography.     Surgeon/Staff: Miachel Rouximothy J Shogo Larkey, MD  Assistant: Arletha PiliSepher Rejai, M.D.    Anesthesia:  Lidocaine 1%  Fluoroscopy Time:   12     seconds                                         Indications: The patient c/o  Axial Low Back Pain and Lower Extremity Pain. The patient's history and physical findings are consistant with Lumbar radiculopathy. They are here for an injection at the site thought to be the source of their pain. The patient has benefited from similar injections in the past.    Procedure in detail:  Written informed consent was obtained.  The chart was reviewed, questions were answered and the patient wished to proceed. The patient had no contraindications to the procedure.  Vital signs were stable. Standard monitoring was applied.  The patient, the procedure nurse, and the physician confirmed the site of injection after an official "time out." The skin over the injection site was also marked and confirmed as the site of the injection.    Localization Time Out:  An additional intraoperative timeout, specifically to confirm accurate localization, was conducted by the attending physician.     The patient remained awake and alert throughout the procedure.  The patient was placed in the prone position.  The skin was prepped with chlorhexidine and sterile drapes were applied. The skin and soft tissues were anesthetized with Lidocaine 1%. A  20 gauge 3.5 inch Touhy needle epidural needle was inserted percutaneously and advanced under fluoroscopic guidance using  AP, and lateral projections. Using loss of resistance to preservative free normal saline and a left sided interlaminar approach, the L5-S1 posterior epidural space was entered after the  lamina was contacted with the epidural needle.  No paraesthesias occurred and aspiration was negative   Epidurography and radiographic interpretation: Epidurography was performed by injection of Omnipaque 240 under live fluoroscopy.  Multiple Xray images were obtained.  Radiologic examination confirmed proper spread of the contrast medium within the epidural space.  There was no evidence of intrathecal or intravascular runoff. AP images obtained and Oblique images obtained..    The needle was injected with dexamethasone (10 mg/mL)  5 mg, Preservative free normal saline 2 mL and Lidocaine (1%)  0.5 mL.  The needle was removed.  A sterile bandage was applied.  The patient did not experience any hemodynamic or neurologic sequelae.  The attending physician was present during all critical points of the procedure.    The patient was transferred to the recovery area. Post operative instructions were explained and given to the  patient. The patient was discharged in stable health and given a follow up appt. with PA  in 3 week(s) .

## 2016-03-08 NOTE — Discharge Instructions (Signed)
Patient verbalized understanding of below instructions    Procedure Done Today: Lumbar epidural steroid injection with an emphasis on the left side    Your next clinic and procedure will be in the new building next door called the [facility]  Please, call to set up a clinic visit for 4-6 weeks from now  Dr. Wynn MaudlinFurnish would like you to call and set up your cervical MRI    Please call [facility] Scheduling at [phone] to schedule your appointment at your earliest convenience. In order to obtain the most accurate insurance approval, we secure authorization after you are registered and scheduled for imaging services. Please have your insurance cards available when scheduling your exam.      Post Procedure Instructions  Continue present medication unless otherwise indicated.  Resume your normal diet after being discharge.  Ice pack to treatment site (no more than 20 minutes at a time) if needed for pain relief and/or muscle spasm.  Avoid strenuous activities and driving until tomorrow morning.  If you have a band-aid dressing, you may remove it tomorrow morning.  Resume normal activities tomorrow morning, unless otherwise directed.  No submersion in water for 24 hrs.  If your next appointment is at the Pain Clinic, then please call [phone] to make an appointment.  If your next appointment is another procedure, then please call [phone] to make an appointment.    Call the [facility],  [phone] and ask for the pain management provider on call for ANY sign of:  Temperature above 101.5 F  Redness or drainage at the treatment site  Severe, uncontrollable headache    Call 911 IN CASE OF EMERGENCY

## 2016-08-02 ENCOUNTER — Telehealth (HOSPITAL_BASED_OUTPATIENT_CLINIC_OR_DEPARTMENT_OTHER): Payer: Self-pay | Admitting: Interventional Pain Management

## 2016-08-02 NOTE — Telephone Encounter (Signed)
Patient is calling regarding the Lumbar ESI she had on 03/08/2016. Patient stated that the pain has returned and that she experienced a significant amount of relief from her last Lumbar ESI. Offered to schedule a follow up visit in clinic but patient declined because she's out of town. She is requesting for the order to be placed and is requesting a call back at [phone]. Per patient, it is OK to leave a message.

## 2016-08-02 NOTE — Telephone Encounter (Signed)
Spoke with patient. Has not been seen for follow up since initial LESI. Instructed that she will need to schedule clinic visit in order to proceed with repeat injection.

## 2019-01-26 ENCOUNTER — Encounter (INDEPENDENT_AMBULATORY_CARE_PROVIDER_SITE_OTHER): Payer: Self-pay

## 2020-10-13 IMAGING — CT CT ABD/PEL WO/W CONT
2 of 5 series · 12 of 46 positions shown, 14 images · non-contrast
Comparison: Lumbar spine x-ray study from same day.

HISTORY: Left lower quadrant pain.
TECHNIQUE: Axial images of the abdomen and pelvis are obtained from the hemidiaphragms to the pubic symphysis prior to and following 100 mL Hsovue-0UU intravenous contrast.  Serum creatinine 0.8 mg/dL. 1 L Gastrografin/water oral contrast given. Coronal and sagittal reformation images obtained. Dose reduction technique used: Automated exposure control and adjustment of the mA and/or kV according to patient size.  CT count in previous 12-months: 0

[Series 2: pre contrast · axial · non-contrast · 0.47mm/px · z∈[+1109,+1459]mm · 9 of 82 slices shown, 11 images]
[im 6/82  soft-tissue]
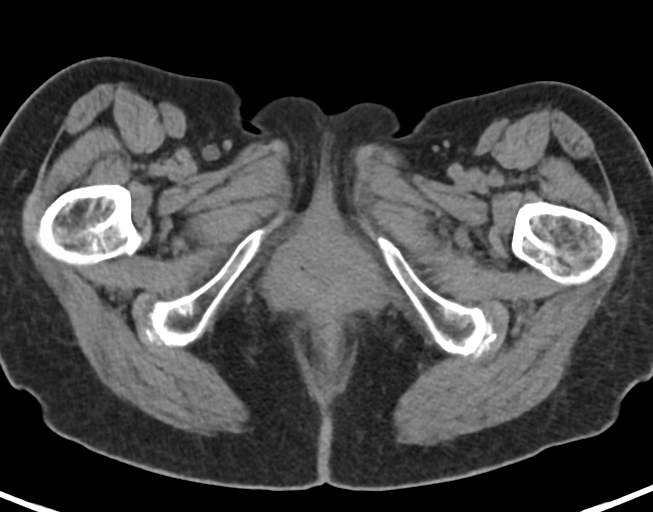
[im 6/82  bone]
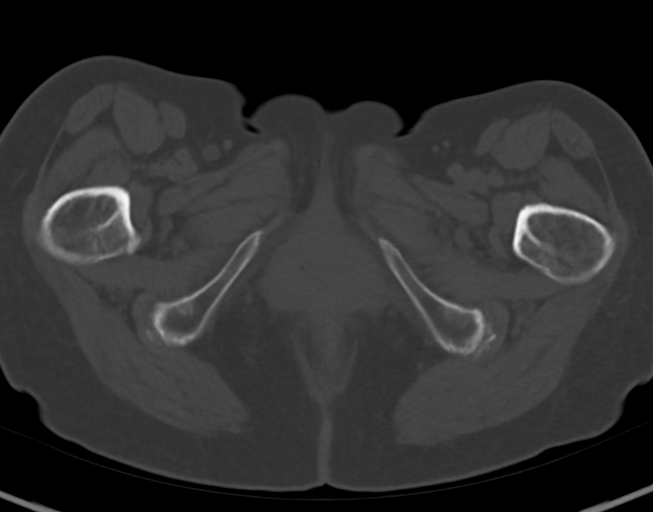
[im 18/82  soft-tissue]
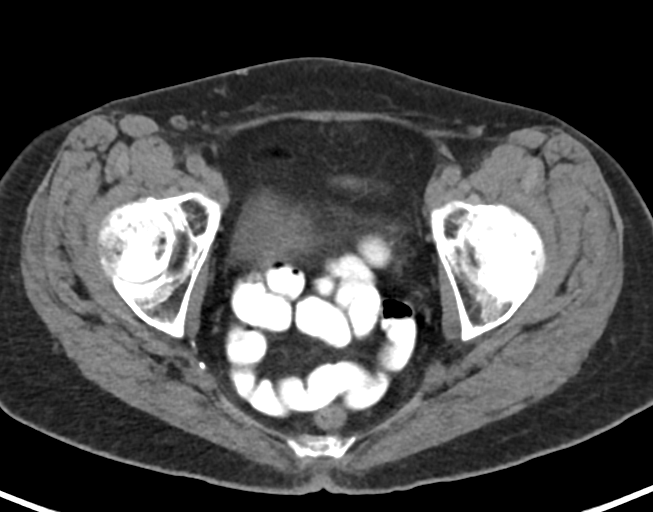
[im 24/82  soft-tissue]
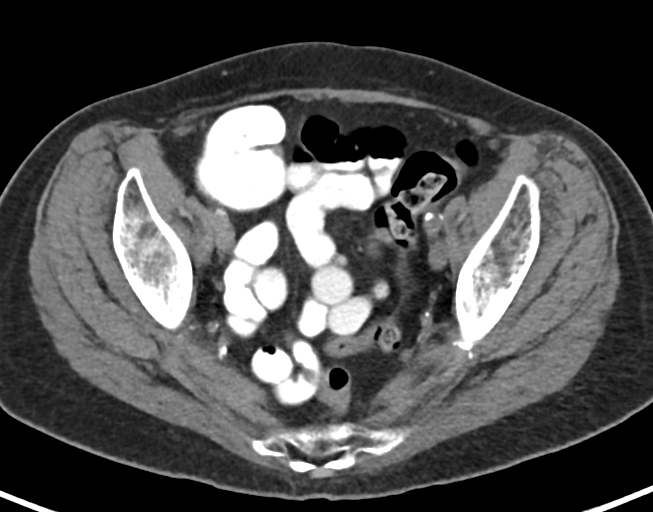
[im 35/82  soft-tissue]
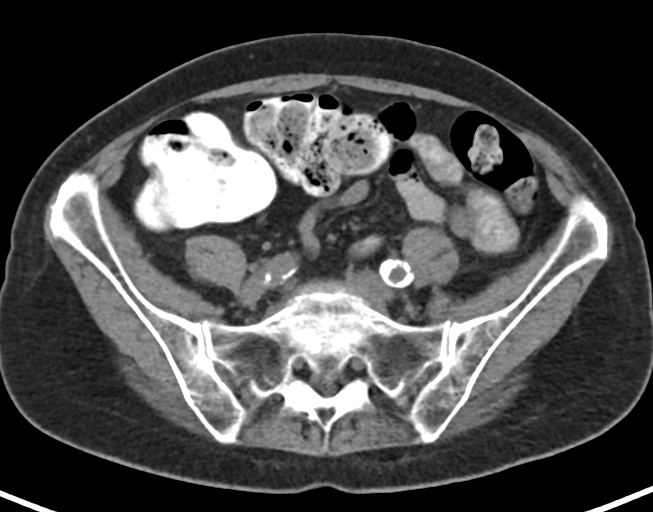
[im 41/82  soft-tissue]
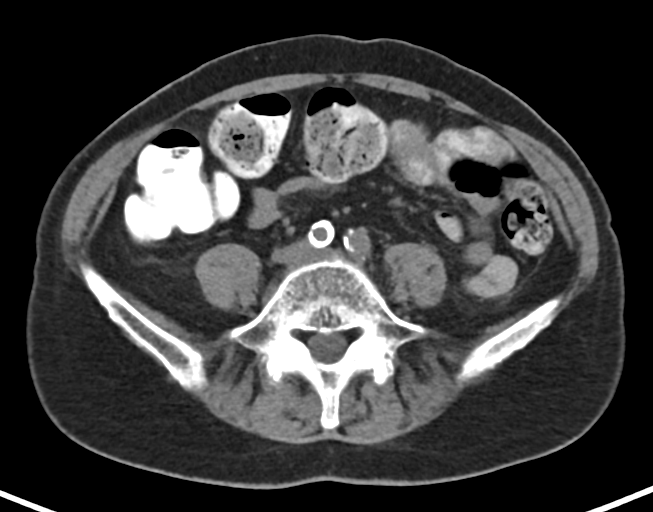
[im 47/82  soft-tissue]
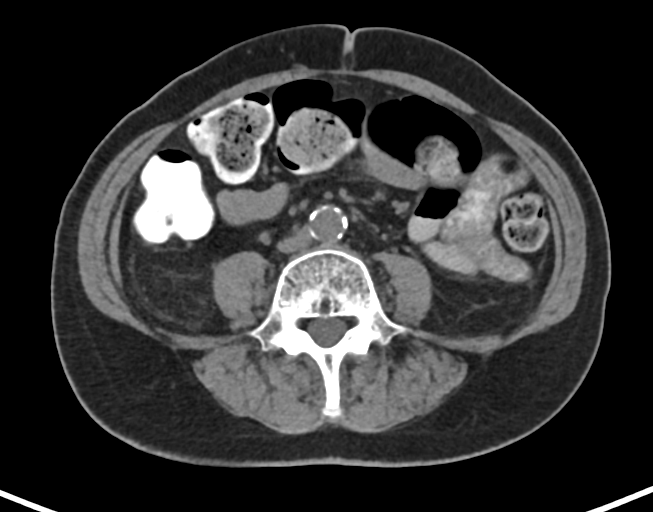
[im 58/82  soft-tissue]
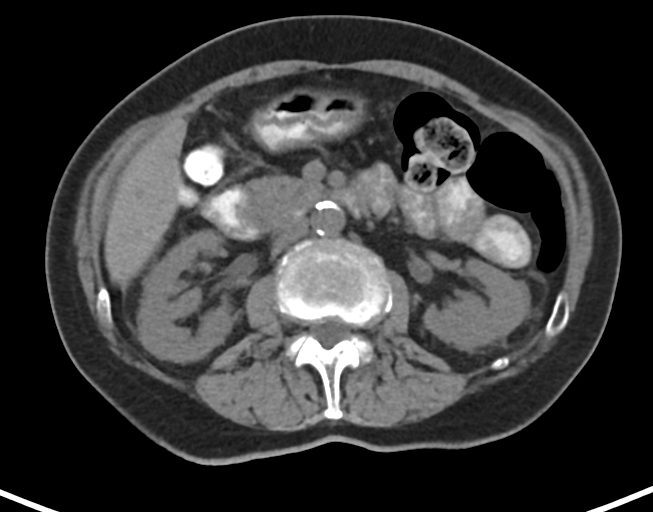
[im 64/82  soft-tissue]
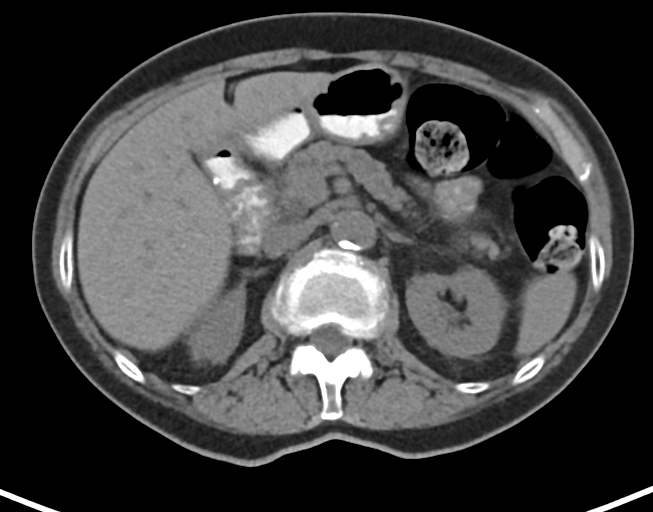
[im 76/82  soft-tissue]
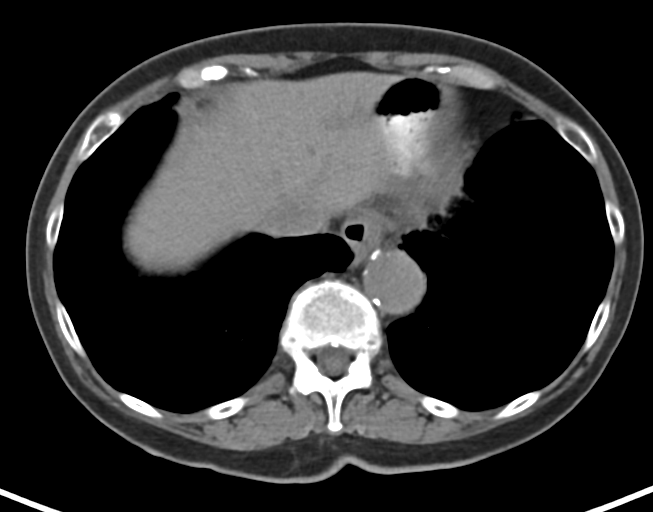
[im 76/82  bone]
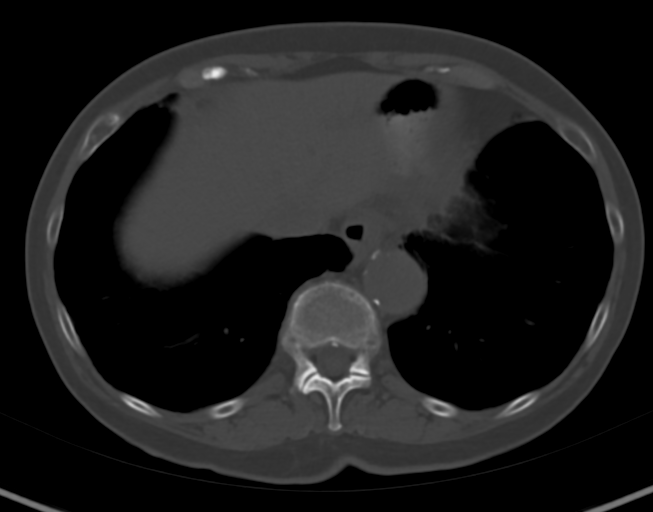

[Series 8: coronal · coronal · 0.60mm/px · 3 of 43 slices shown]
[im 15/43  soft-tissue]
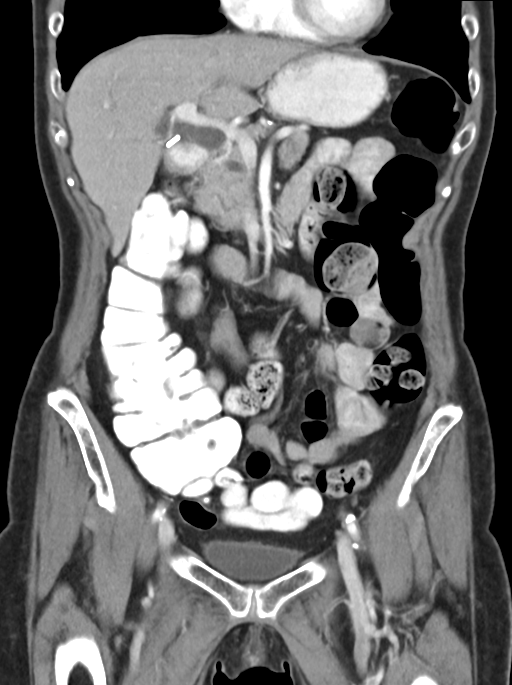
[im 19/43  soft-tissue]
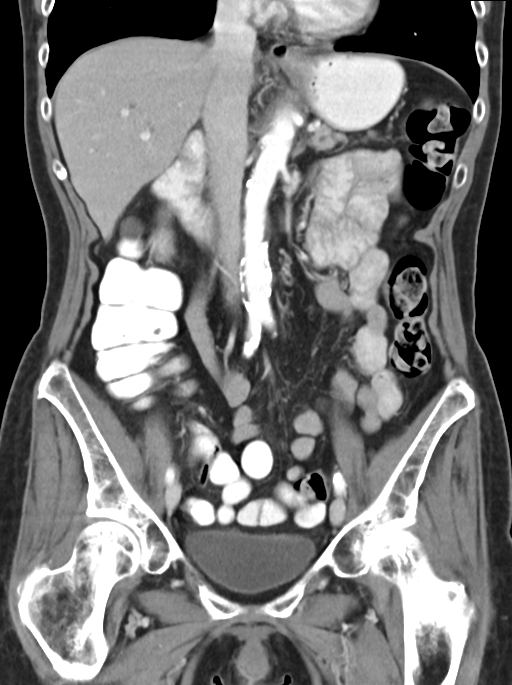
[im 24/43  soft-tissue]
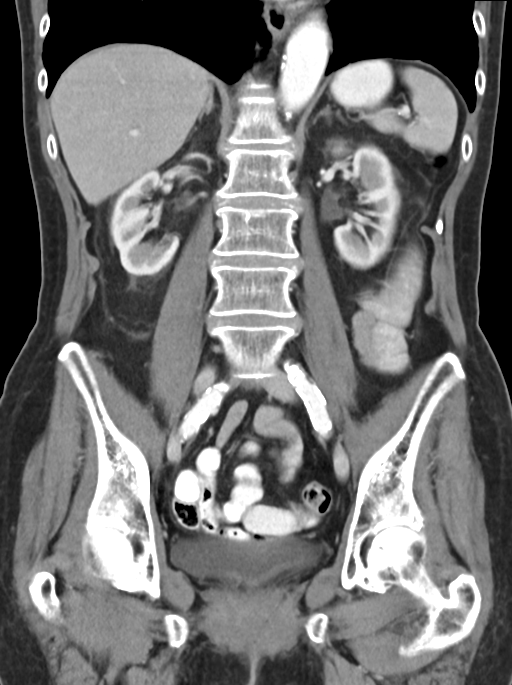

[12 of 46 positions shown; findings below may reference images not displayed]

FINDINGS: CT ABDOMEN: Subsegmental atelectasis involving middle lobe, lingular division of left upper lobe, to lesser degree lower lobes seen. No pulmonary nodule is identified.

Heart size is normal. There is no pericardial effusion. Calcified plaques of aorta and its branches seen without aneurysm. Noncalcified plaques also seen. Inferior vena cava is normal.

There is prior cholecystectomy. Physiological dilation of common bile duct up to 13 mm seen. No choledocholithiasis is identified. There is appearance of pancreatic divisum. Dilated pancreatic duct up to 6 mm in diameter seen. No stone of pancreatic duct identified. Mild intrahepatic biliary ductal dilation seen. No liver mass or cyst is identified. The rest of the pancreas is normal without mass or large cysts.

Spleen and adrenal glands are normal. Small simple cysts of kidneys seen.

Distal esophagus, stomach and is small bowel loops are normal. Appendix is normal. Moderate amount of stool seen in the colon.

Umbilical fatty hernia of 1 x 0.5 cm seen.

Degenerative changes of spine seen. Grade 1 anterospondylolisthesis of L1 on L2, L2 on L3, L3 on L4, L4 on L5 and L5 on S1 seen. Baastrup's disease between spinous processes of lumbar spine identified.

Increased subcutaneous adipose tissue seen.

CT PELVIS: Calcified and noncalcified plaques of iliac arteries identified. Uterus is surgically. Vaginal cuff is normal. Ovaries are not seen. Urinary bladder is normal.

Moderate amount of stool seen in the sigmoid colon. Rectum is normal.

No enlarged pelvic wall or inguinal lymph nodes seen. No obvious inguinal hernia is seen.

Mild degenerative changes of SI joints, hip joints and pubic symphysis identified.

Coronal and sagittal reformation images confirm above findings.
IMPRESSION: Pancreatic divisum with dilated pancreatic duct identified.

Moderate amount of stool seen in the sigmoid colon identified.

Small umbilical fatty hernia seen.

Additional chronic findings as detailed above.

No other acute disease process.

Total radiation dose to patient is CTDIvol 11.38 mGy and DLP 504.00 mGy-cm.

## 2020-10-13 IMAGING — CR L-SPINE 4 VWS MIN
1 series · 5 of 5 positions shown · non-contrast
Comparison: None

Lumbar spine 5 views
HISTORY: Low back pain

[Series 1: t lumbar spine ap · 0.15mm/px · 5 of 5 slices shown]
[im 1/5]
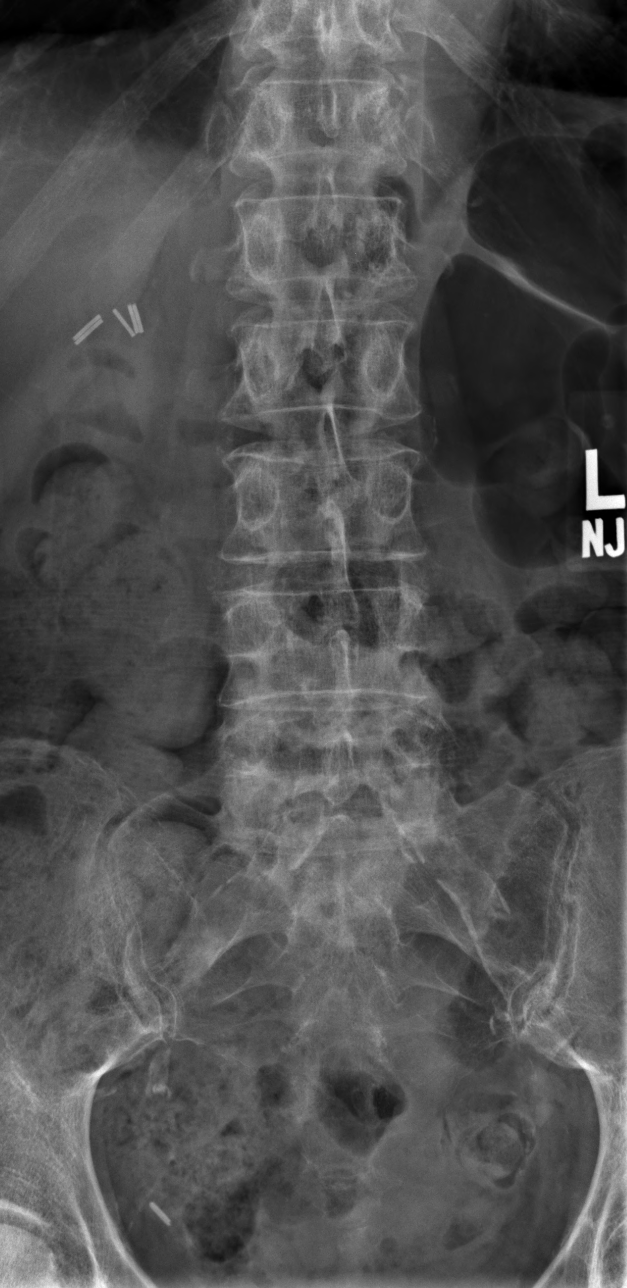
[im 2/5]
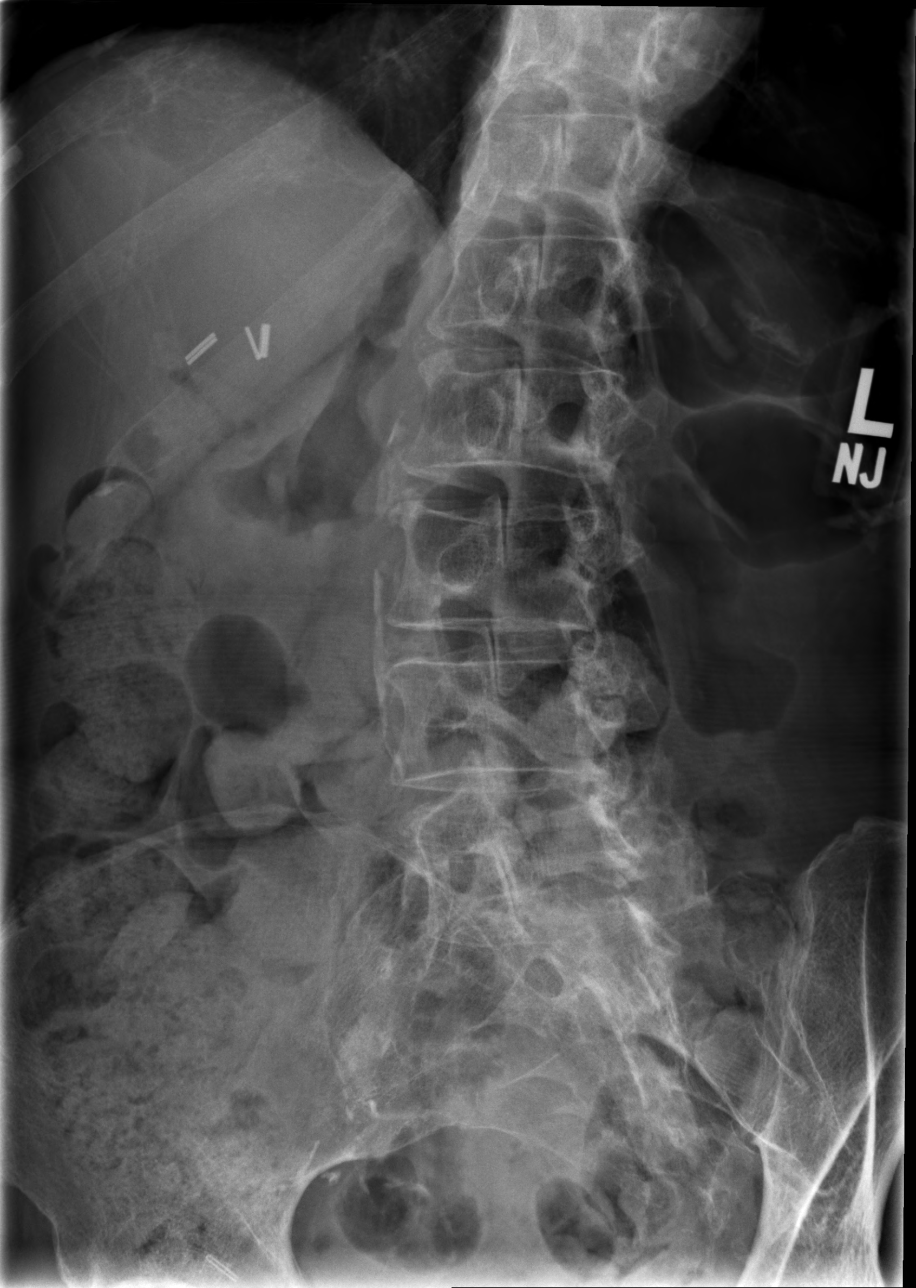
[im 3/5]
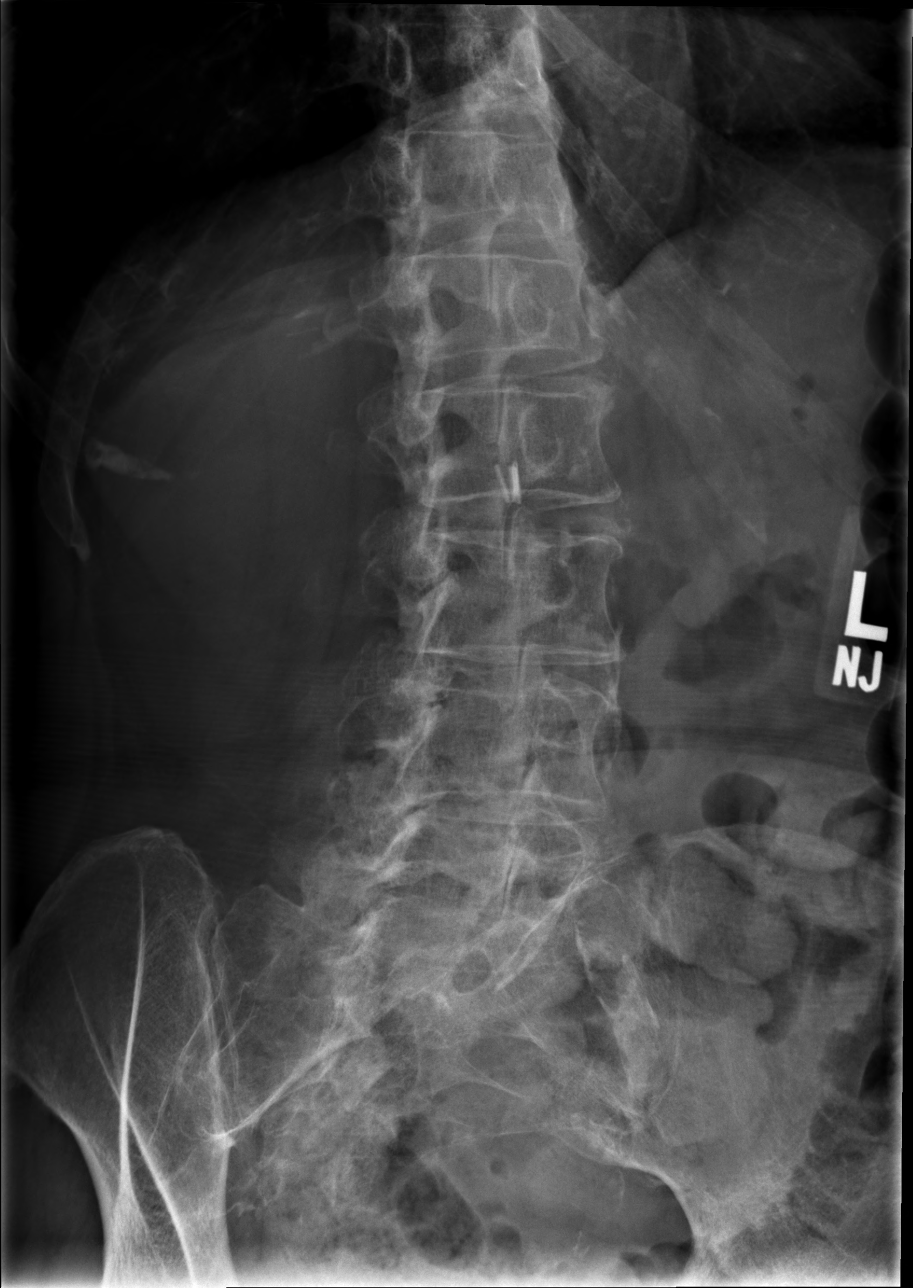
[im 4/5]
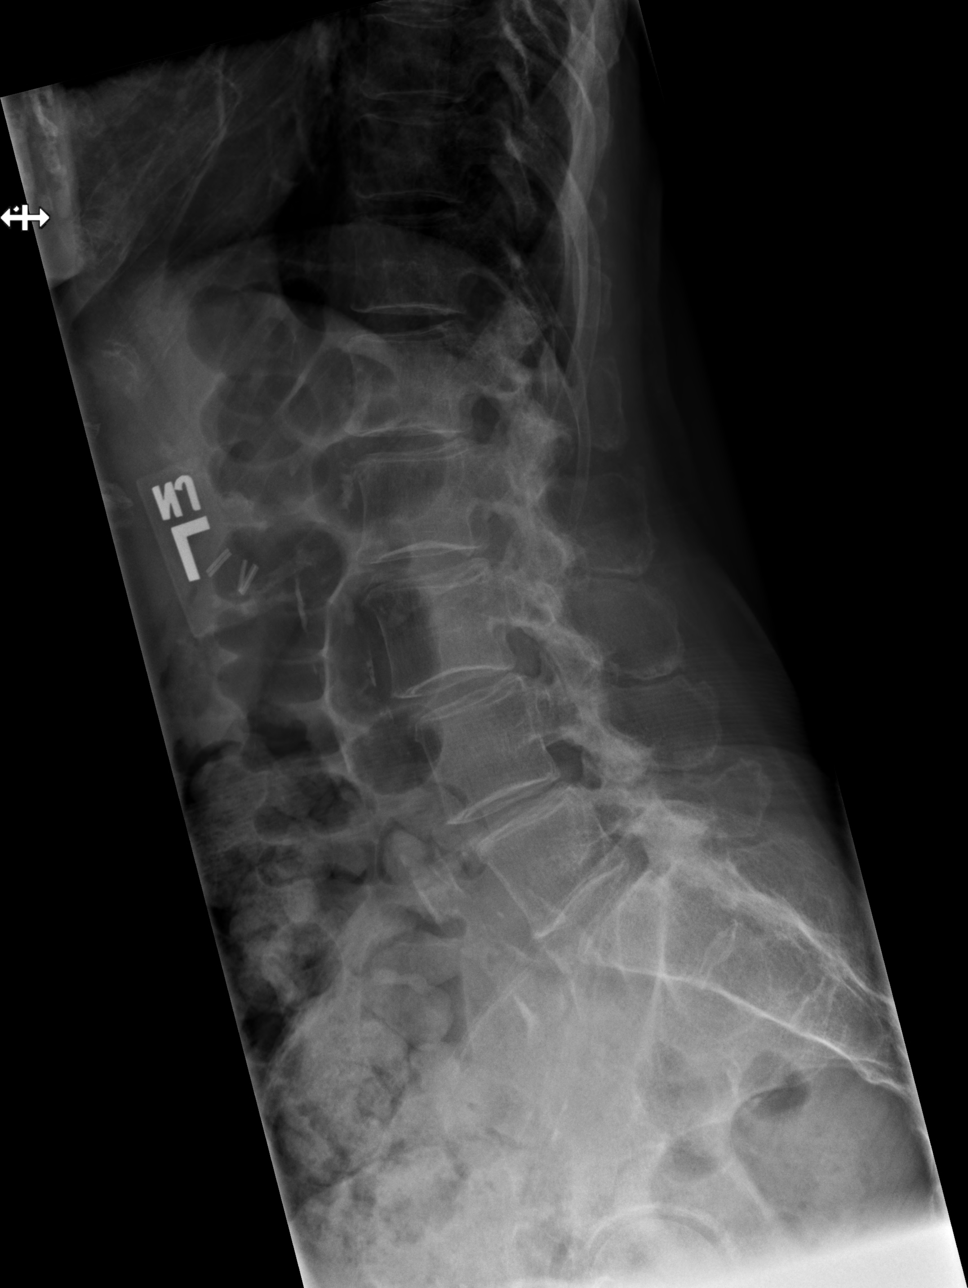
[im 5/5]
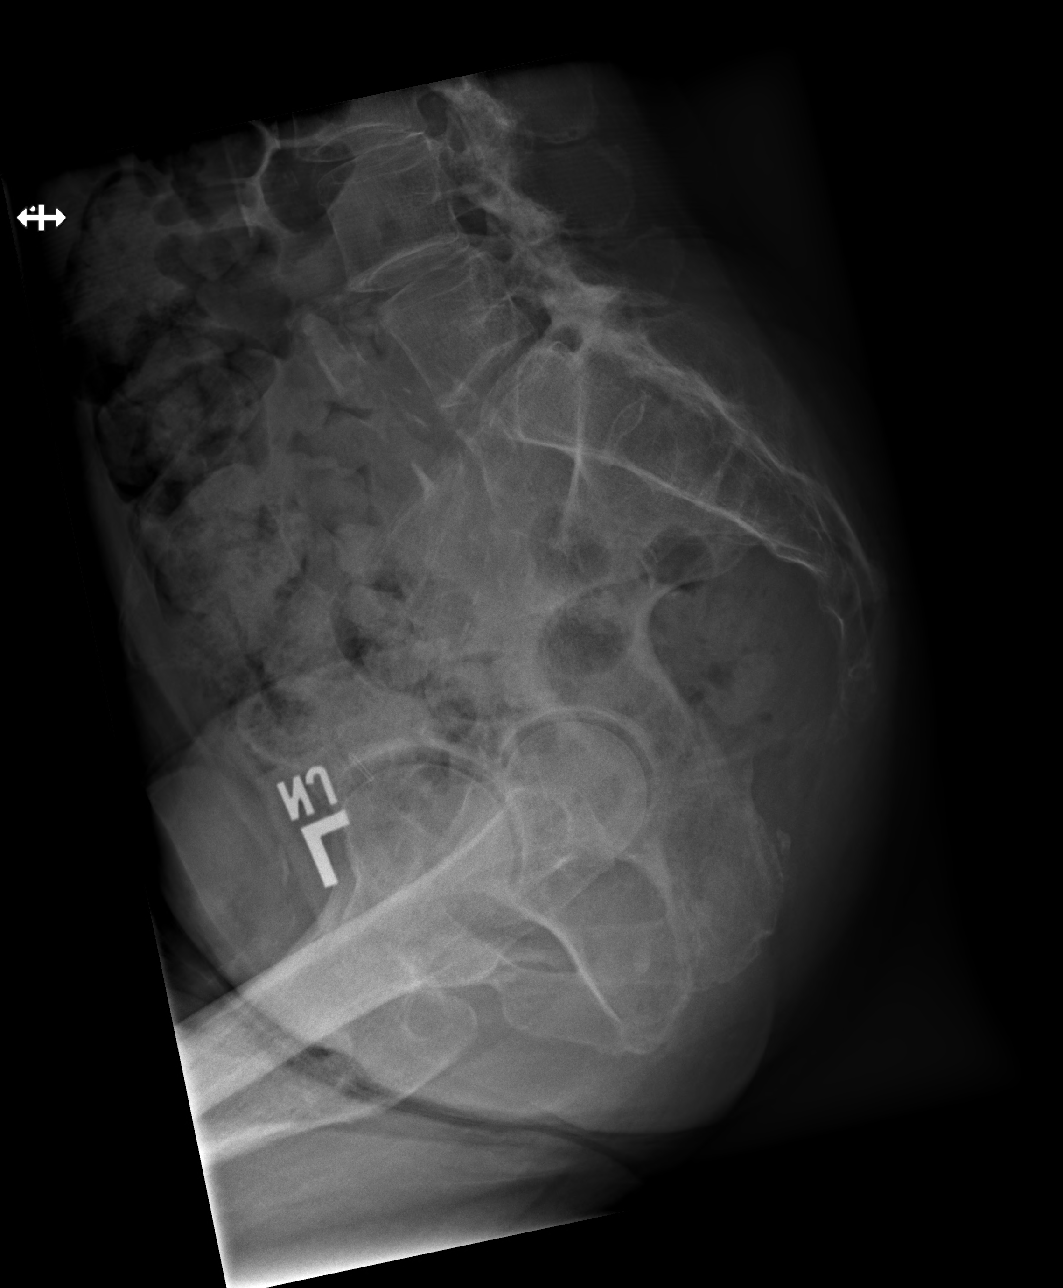

[5 of 5 positions shown; findings below may reference images not displayed]

FINDINGS: There are five lumbar segments. Moderate L4-5 and L5-S1 degenerative disc space narrowing with 2 mm subluxation of L4 over 5 likely related to the moderate facet arthropathy at that level. No wedge compression deformity. Open SI joints. Calcified aortoiliac arterial plaques. Demineralized bones. Cholecystectomy.
IMPRESSION: Chronic degenerative change.

## 2020-11-01 IMAGING — CR HAND RT 3 VWS MIN
1 series · 3 of 3 positions shown · non-contrast
Comparison: None available.

INDICATION: Pain in right hand
TECHNIQUE: 3 views of the right hand are obtained

[Series 1: x hand lat right · 0.15mm/px · 3 of 3 slices shown]
[im 1/3]
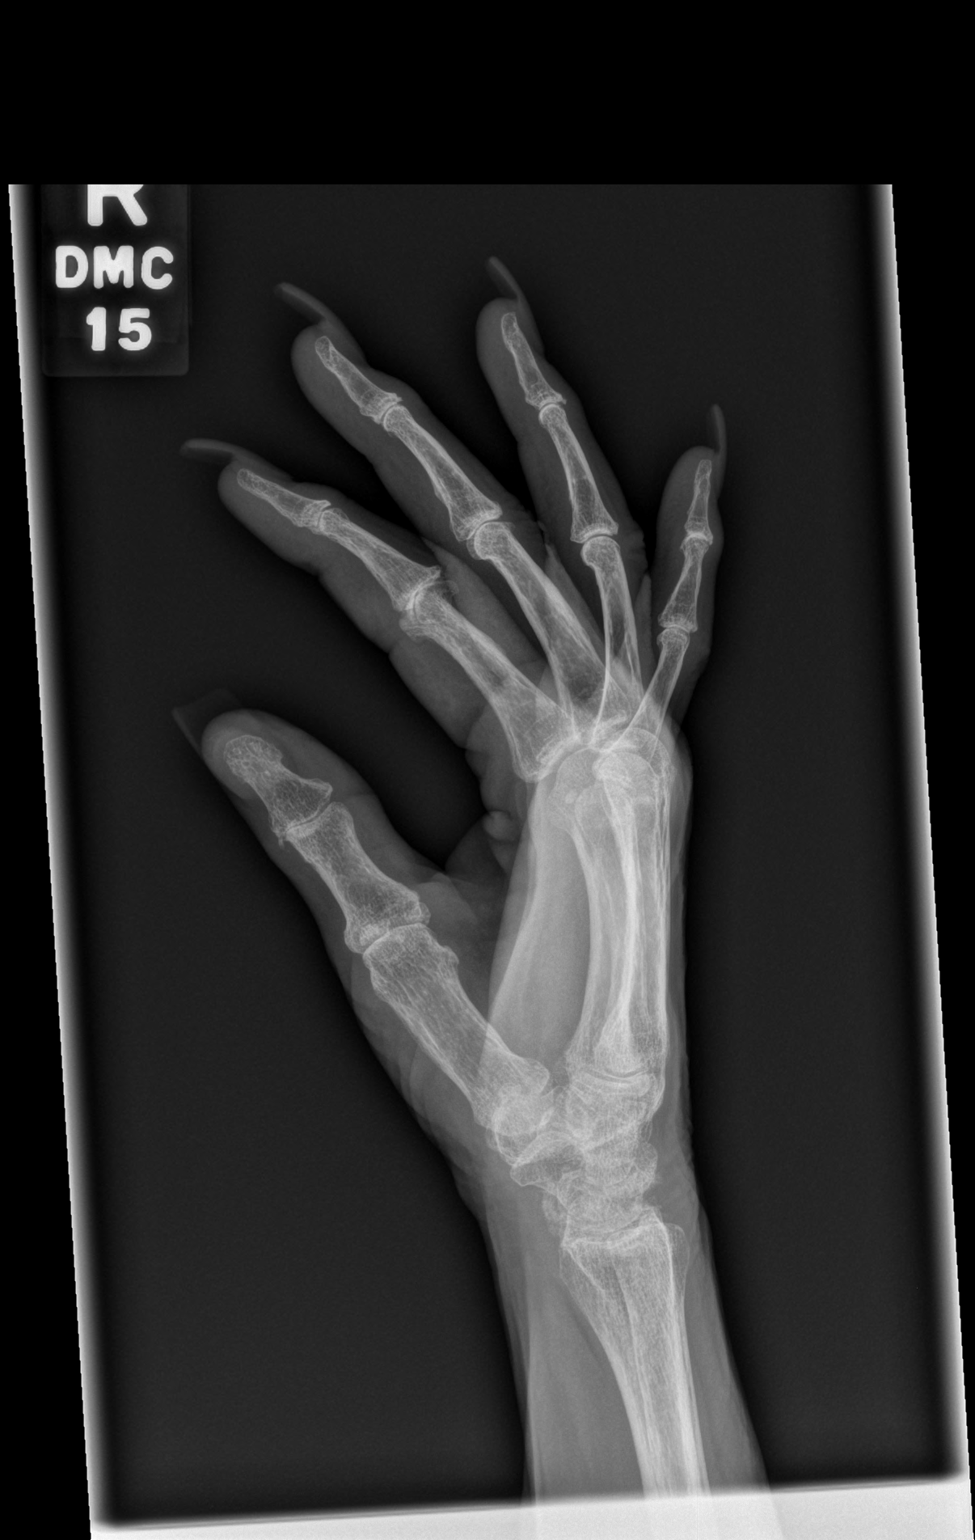
[im 2/3]
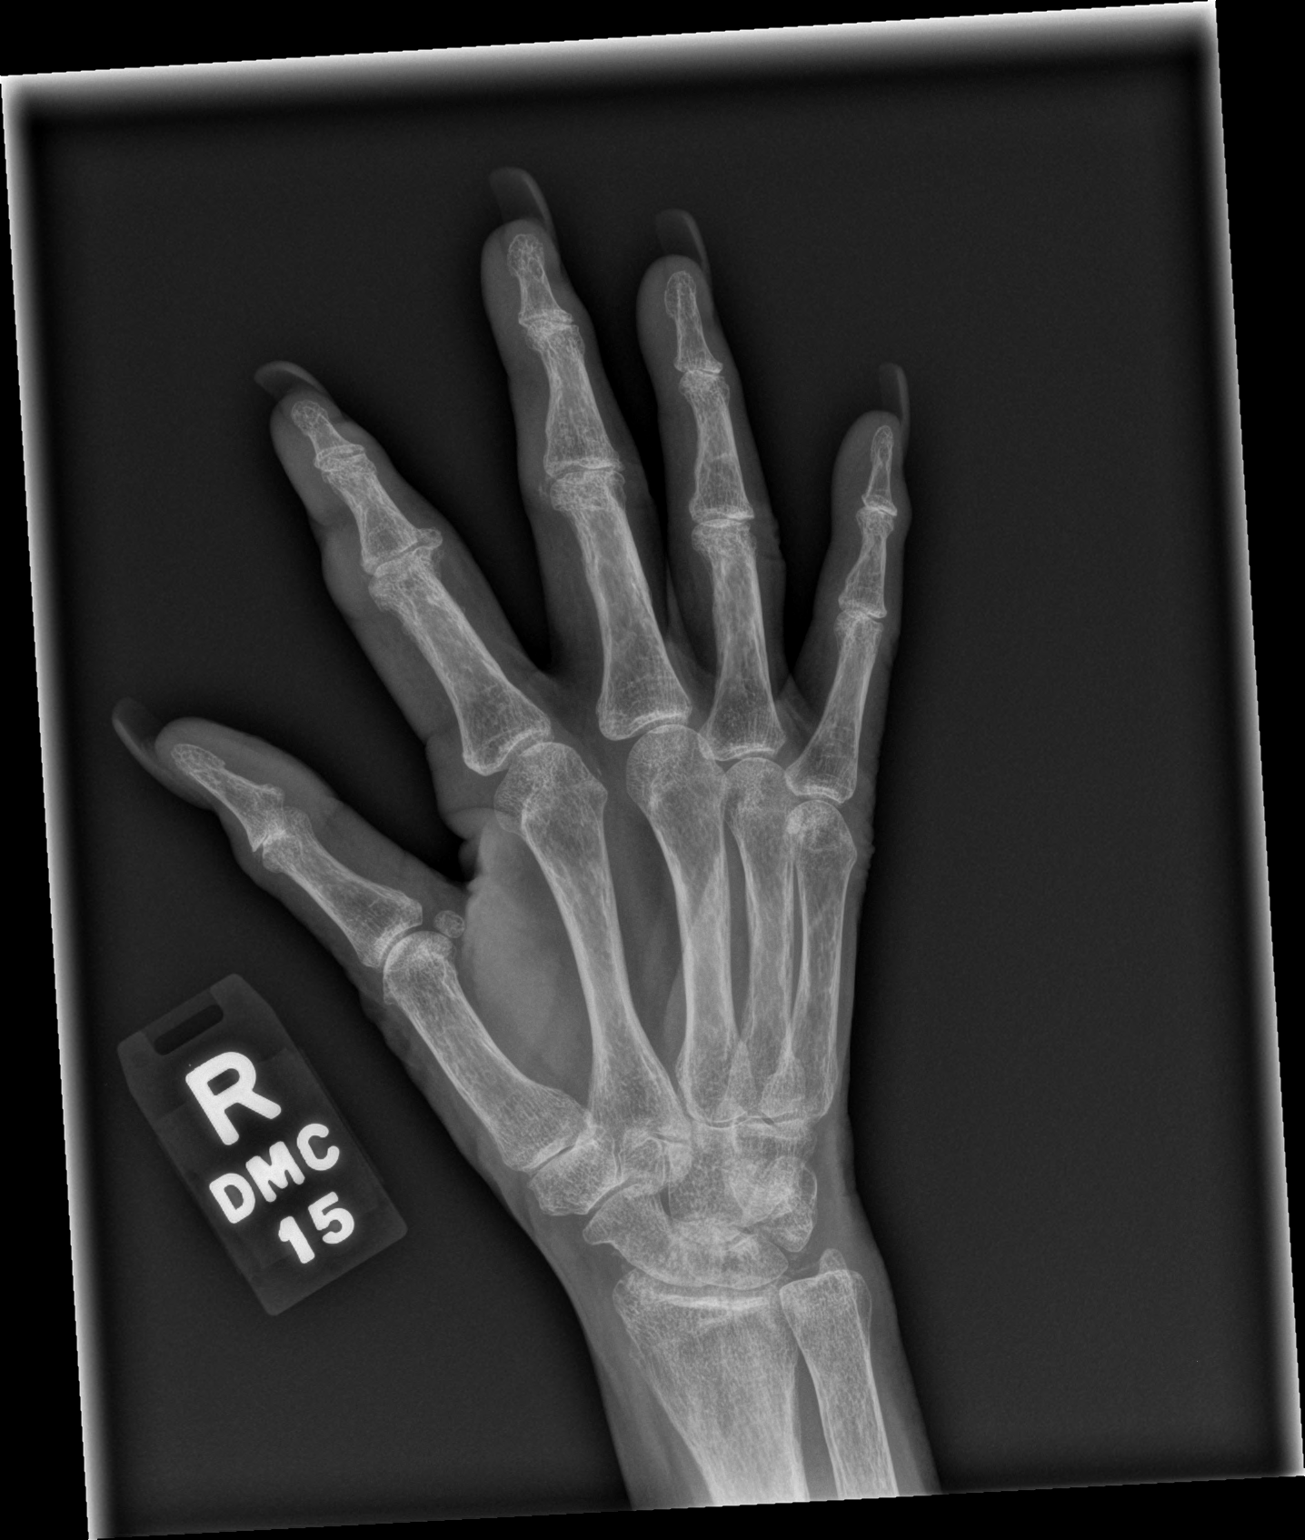
[im 3/3]
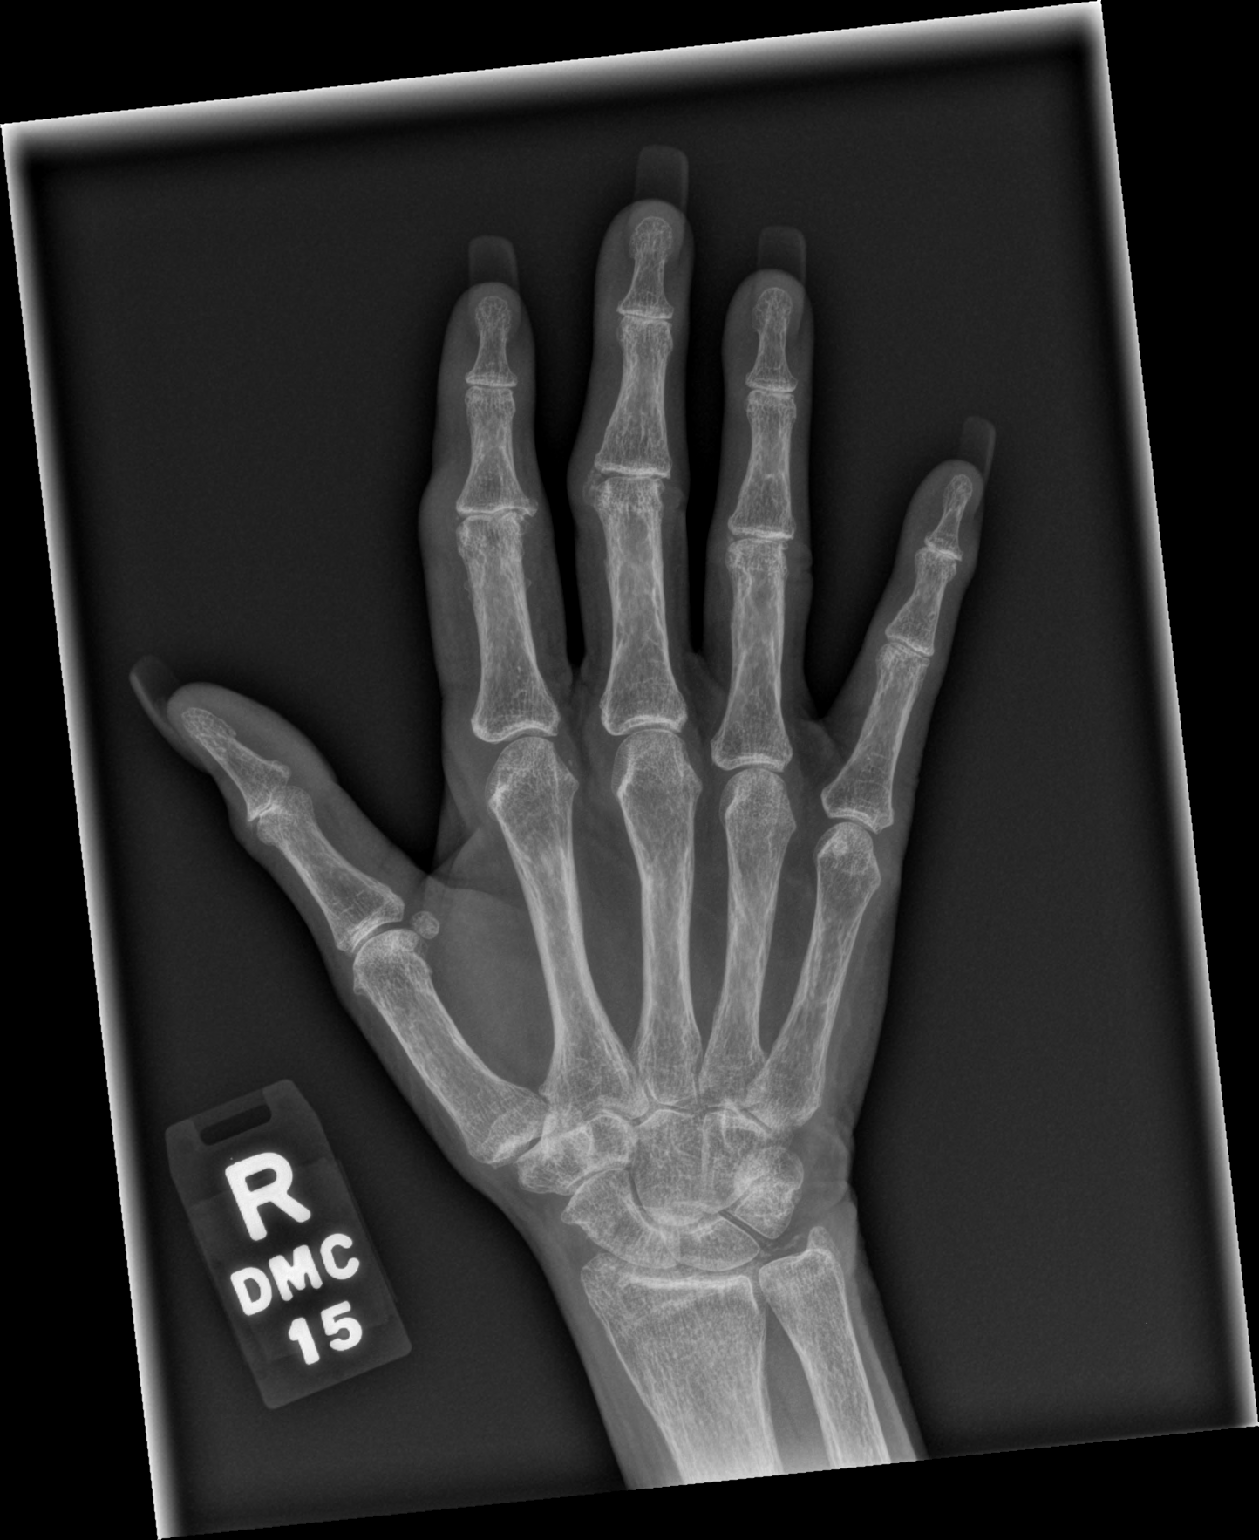

[3 of 3 positions shown; findings below may reference images not displayed]

FINDINGS: No acute fracture or dislocation. Osseous demineralization. Scattered osteoarthritis of the interphalangeal joints of the fingers, most pronounced involving the PIP joint of the index finger. Scattered chondrocalcinosis. Moderate first CMC and triscaphe osteoarthritis. Chondrocalcinosis of the triangular fibrocartilage.
IMPRESSION: 1.
No acute fracture of the right hand.

2.
Osteoarthritis of the hand and wrist, as described above.

3.
Chondrocalcinosis.

## 2020-11-01 IMAGING — CR FINGERS RT 2 VWS MIN
1 series · 3 of 3 positions shown · non-contrast
Comparison: None available.

INDICATION: Pain in second finger of right hand
TECHNIQUE: 3 views of the right index finger obtained.

[Series 1: x finger pa right · 0.15mm/px · 3 of 3 slices shown]
[im 1/3]
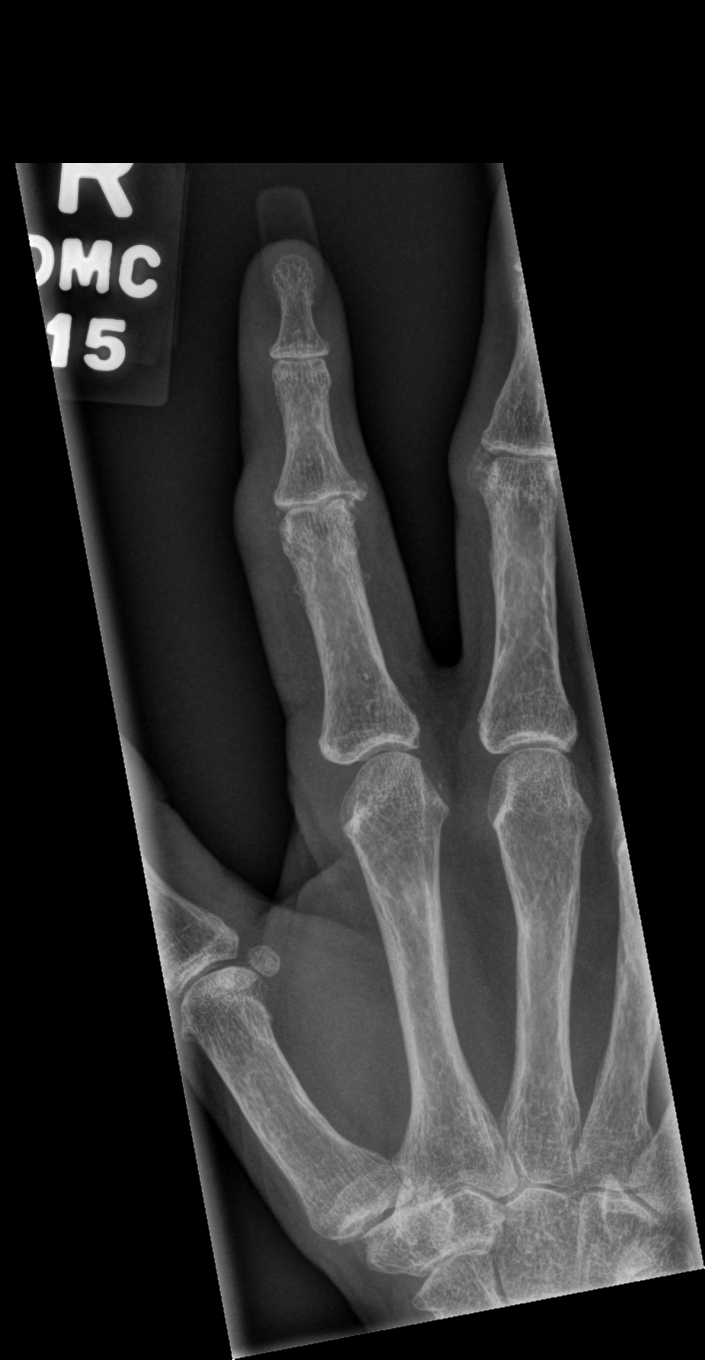
[im 2/3]
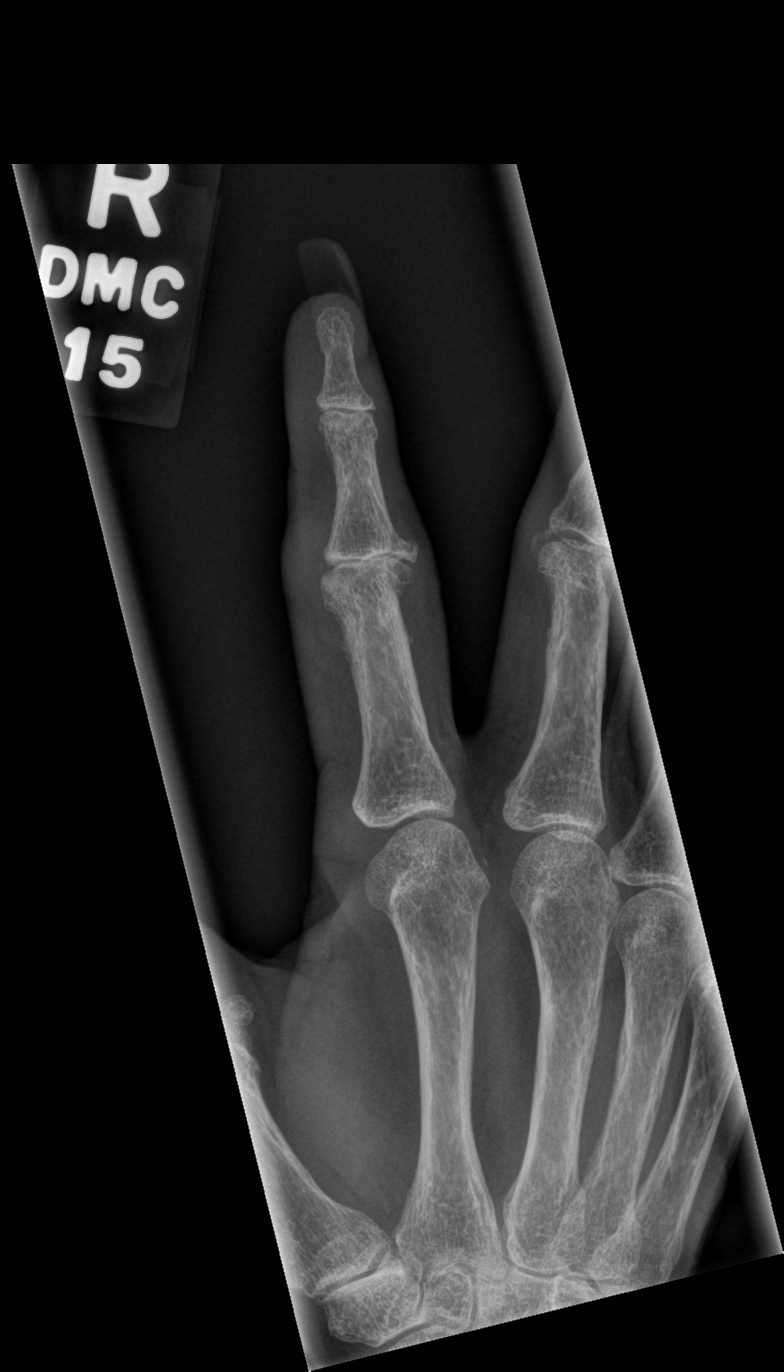
[im 3/3]
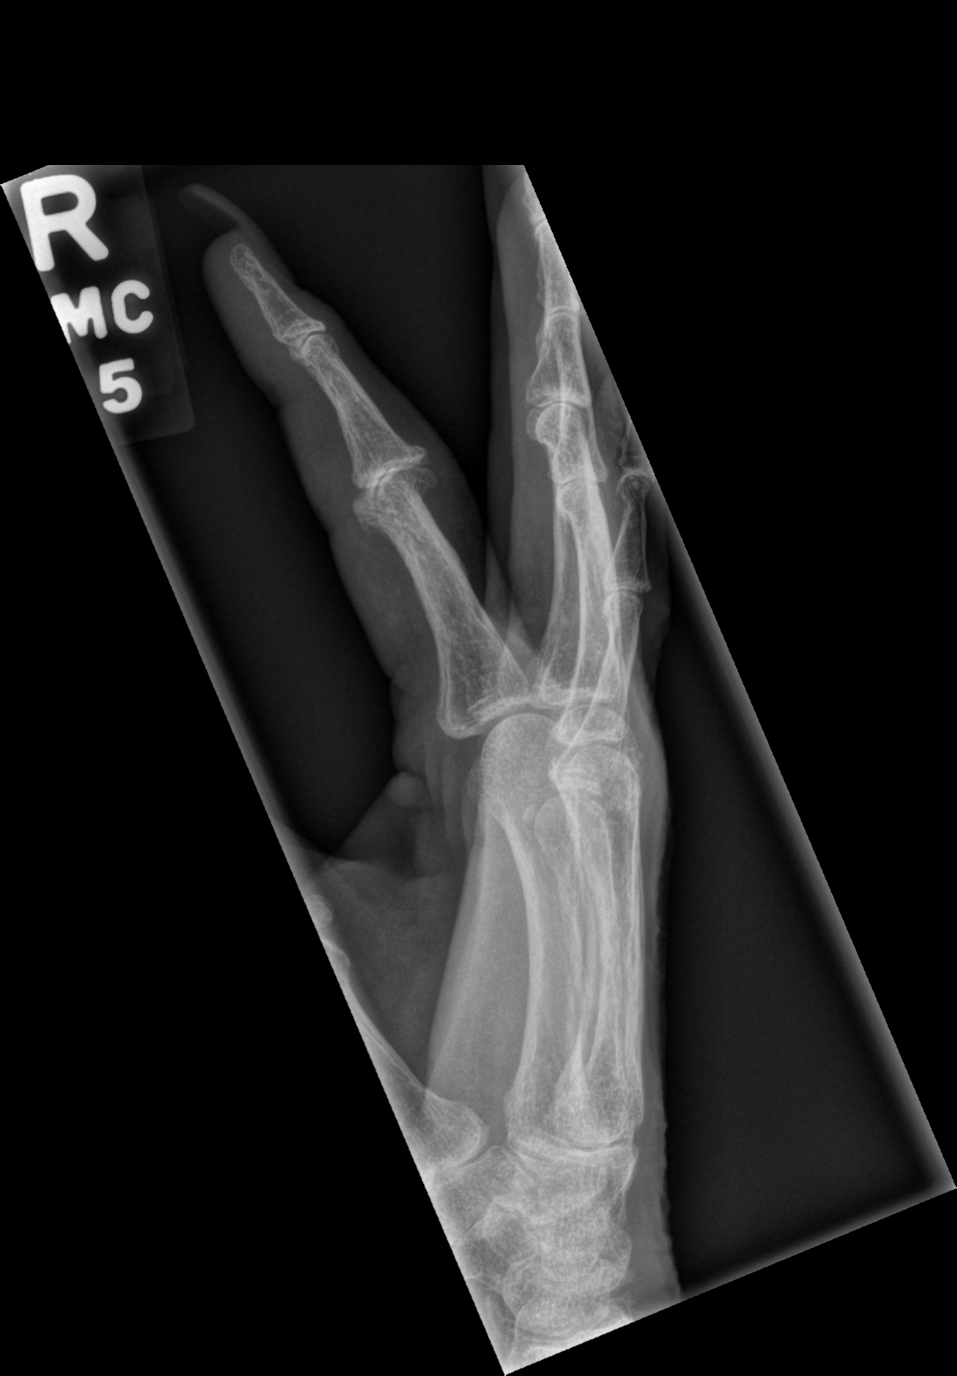

[3 of 3 positions shown; findings below may reference images not displayed]

FINDINGS: No acute fracture or dislocation. Severe osteoarthritis of the PIP joint of the index finger. Mild osteoarthritis of the DIP joint of the index finger. Mild MCP joint osteoarthritis. Moderate first CMC and triscaphe osteoarthritis. Soft tissue swelling is present about the PIP joint of the index finger.
IMPRESSION: 1.
No acute fracture of the right index finger.

2.
Severe osteoarthritis of the PIP joint of the index finger.

## 2020-11-01 IMAGING — CT CT SINUS COMPLETE WO CONTRAST
3 of 4 series · 13 of 47 positions shown, 15 images · non-contrast
Comparison: None

INDICATION: Constant nasal drainage per patient

[Series 2: bone supine · axial · 0.30mm/px · z∈[-566,-482]mm · 7 of 34 slices shown, 9 images]
[im 3/34  brain]
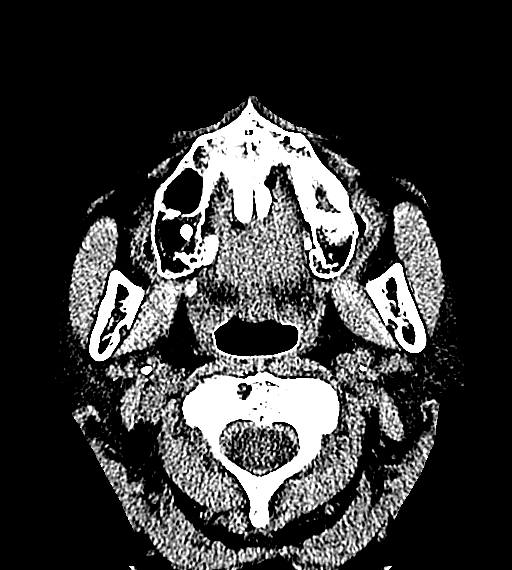
[im 3/34  bone]
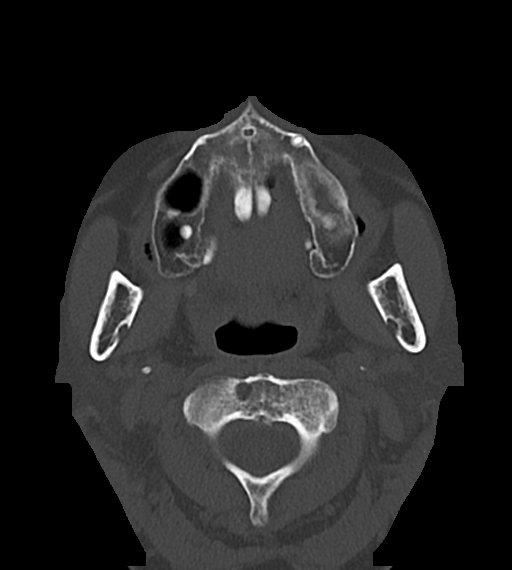
[im 8/34  bone]
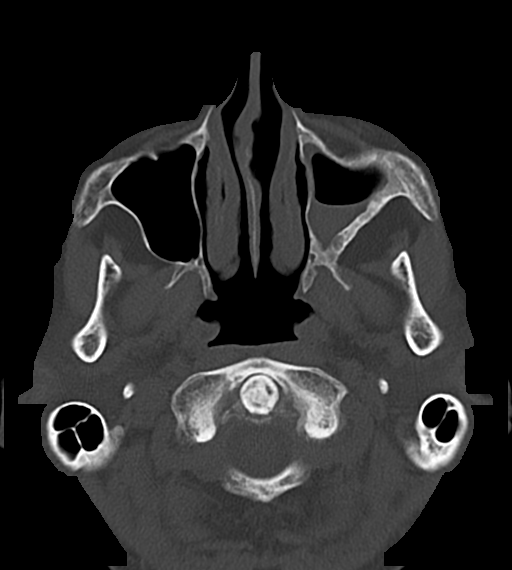
[im 12/34  bone]
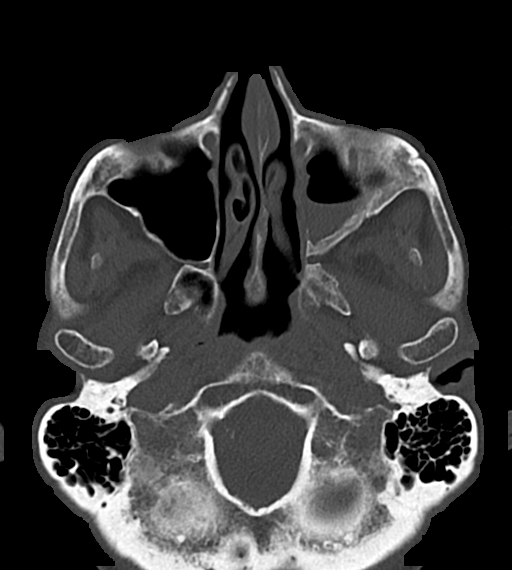
[im 17/34  bone]
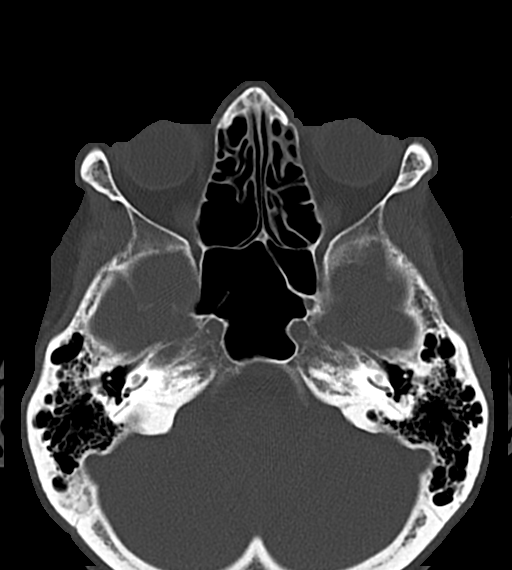
[im 22/34  brain]
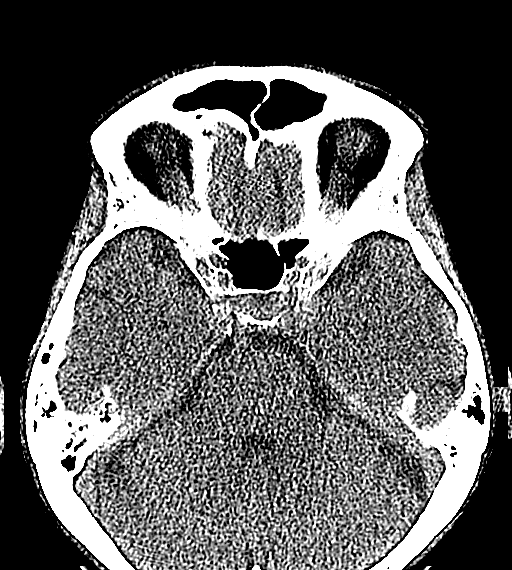
[im 22/34  bone]
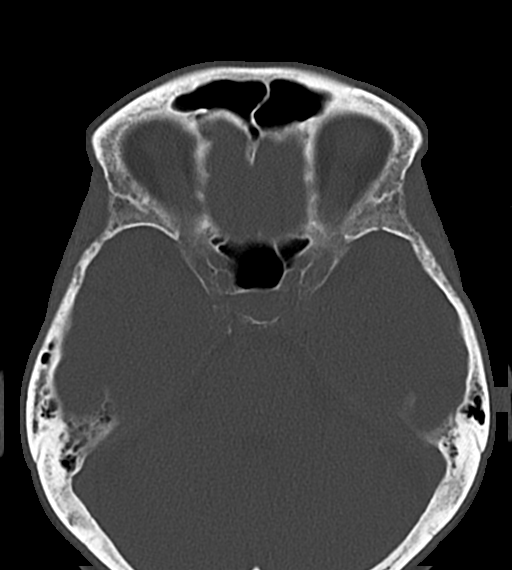
[im 26/34  bone]
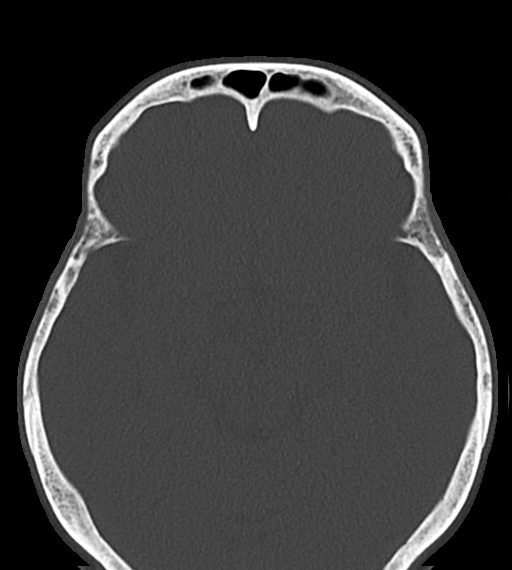
[im 31/34  bone]
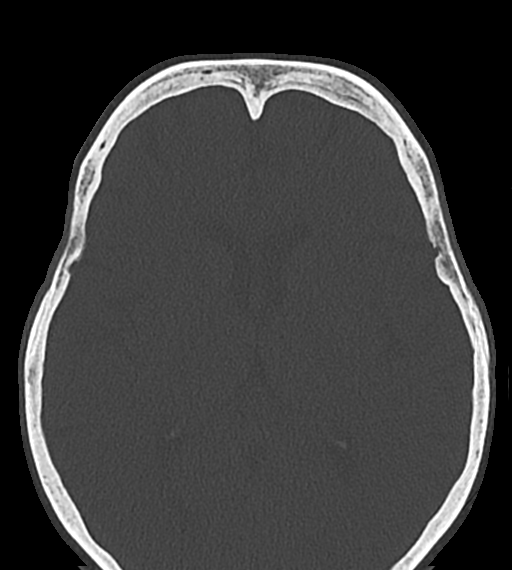

[Series 4: coronal · coronal · 0.20mm/px · 3 of 57 slices shown]
[im 19/57  bone]
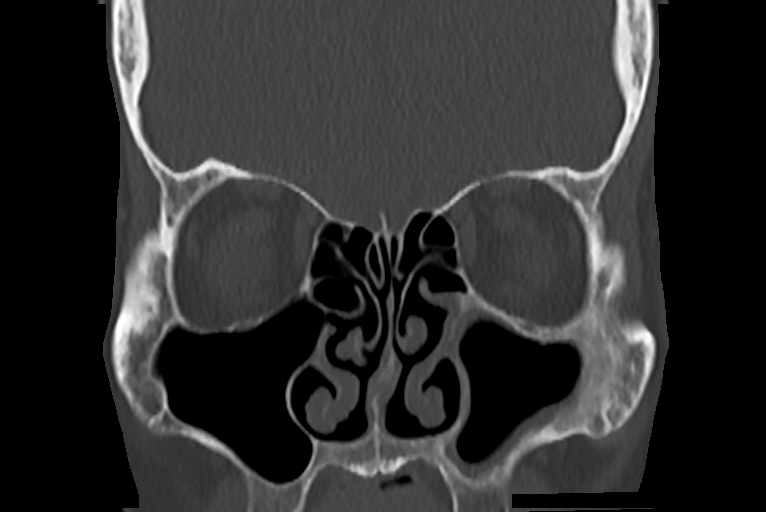
[im 25/57  bone]
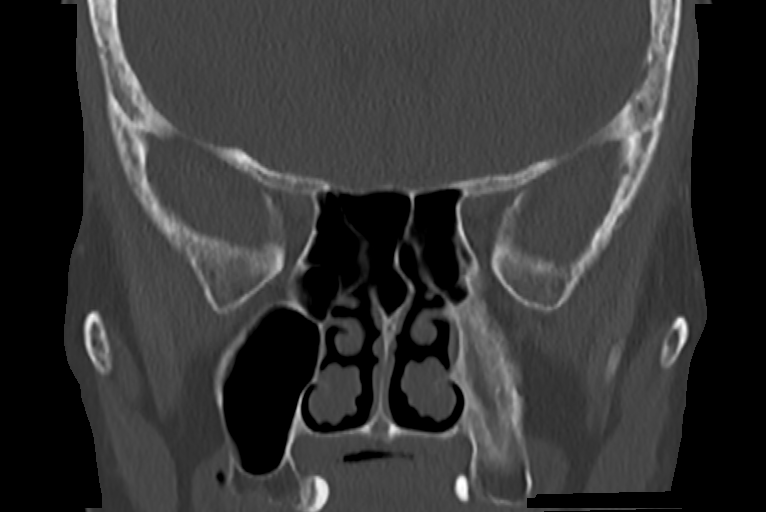
[im 32/57  bone]
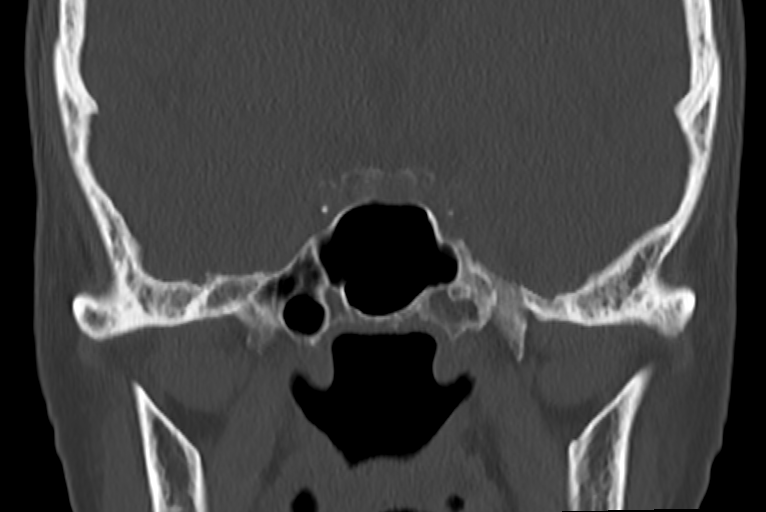

[Series 6: sagittal · sagittal · 0.20mm/px · 3 of 51 slices shown]
[im 17/51  bone]
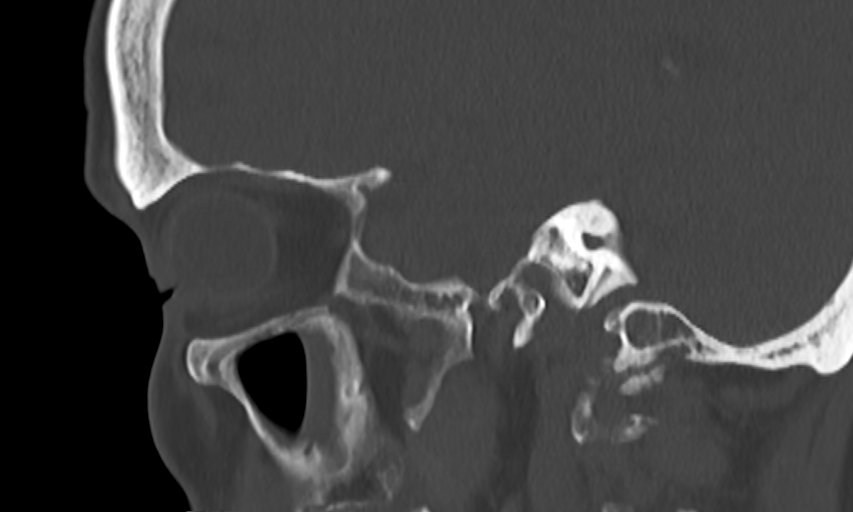
[im 26/51  bone]
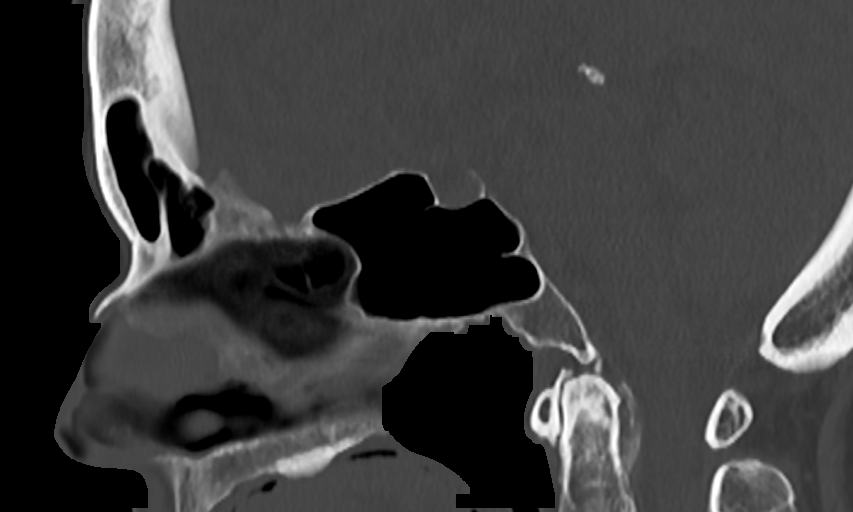
[im 34/51  bone]
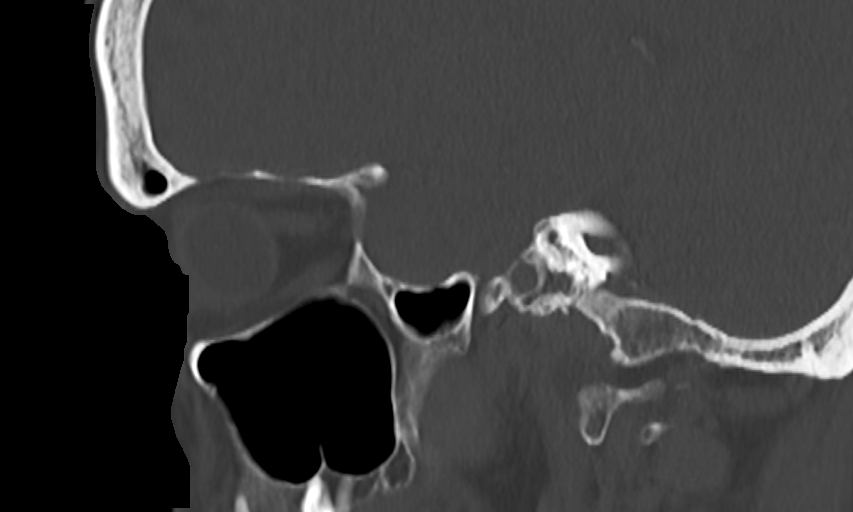

[13 of 47 positions shown; findings below may reference images not displayed]

PROCEDURE: Paranasal sinus CT without contrast.

Dose reduction technique used: Automated exposure control and adjustment of the mA and/or kV according to patient size. CT count previous 12 months: 1
FINDINGS: There is an air-fluid level in the left maxillary sinus in keeping with acute sinusitis. Mild left maxillary mucosal thickening is seen with associated thickening of the anterior/lateral wall of the left maxillary sinus in keeping with chronic sinusitis. Paranasal sinuses otherwise clear. Middle ears and mastoids are well-aerated and clear. There is normal nasopharyngeal airway anatomy. No bone lesion is seen. Visualized brain shows no evidence of mass lesion.
IMPRESSION: Chronic left maxillary sinusitis with air-fluid level in keeping with acute sinusitis.

Total radiation dose to patient is CTDIvol 6.24 mGy and DLP 79.30 mGy-cm.

## 2020-11-23 IMAGING — MR MRCP (MR Cholangiogram w/Reconstructions)
9 of 10 series · 39 of 48 positions shown · IV contrast (15cc Prohance)
Comparison: none

[Series 2: t2_cor · coronal · 6.0mm · 1.00mm/px · 2 of 30 slices shown]
[im 1/30]
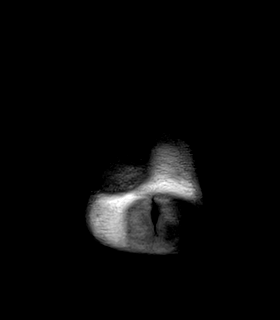
[im 30/30]
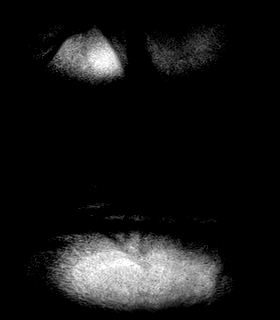

[Series 3: t2_axial_fs_bh · axial · 6.0mm · 1.09mm/px · z∈[-107,+102]mm · 3 of 30 slices shown]
[im 1/30]
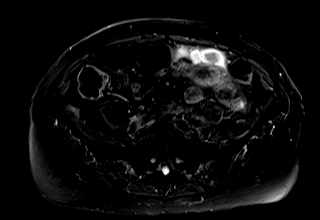
[im 15/30]
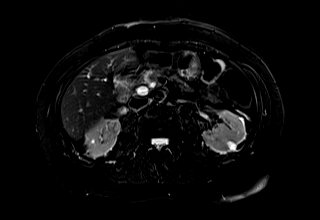
[im 30/30]
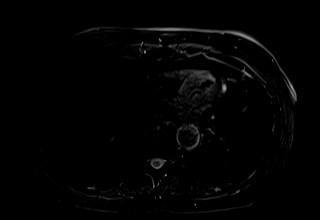

[Series 7: t2_space_cor_cs20_trig_384_iso · coronal · 1.0mm · 0.49mm/px · 6 of 72 slices shown]
[im 1/72]
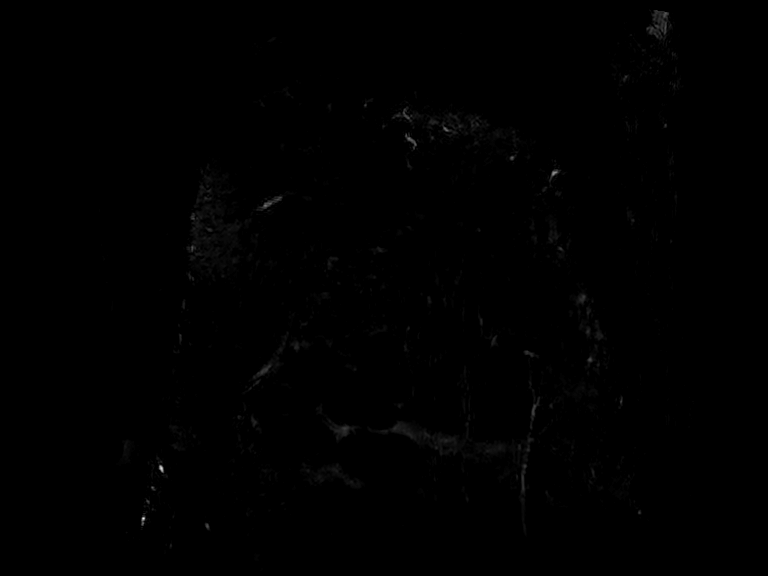
[im 15/72]
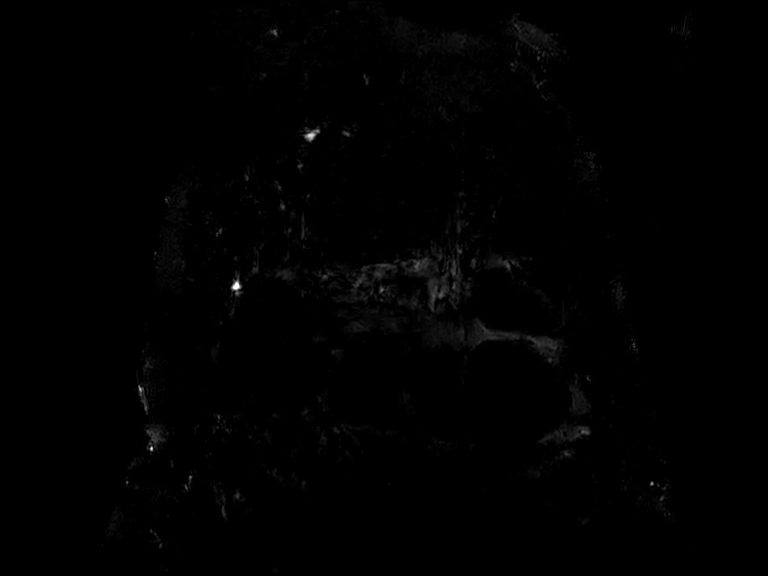
[im 29/72]
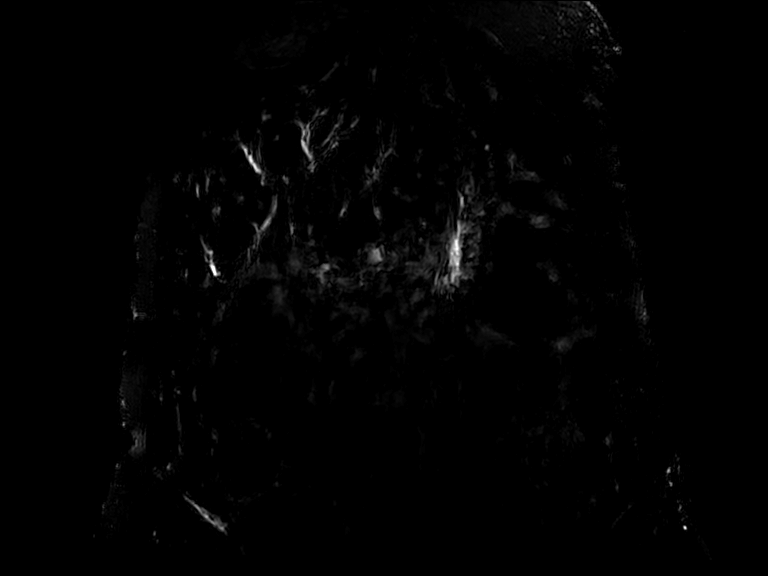
[im 43/72]
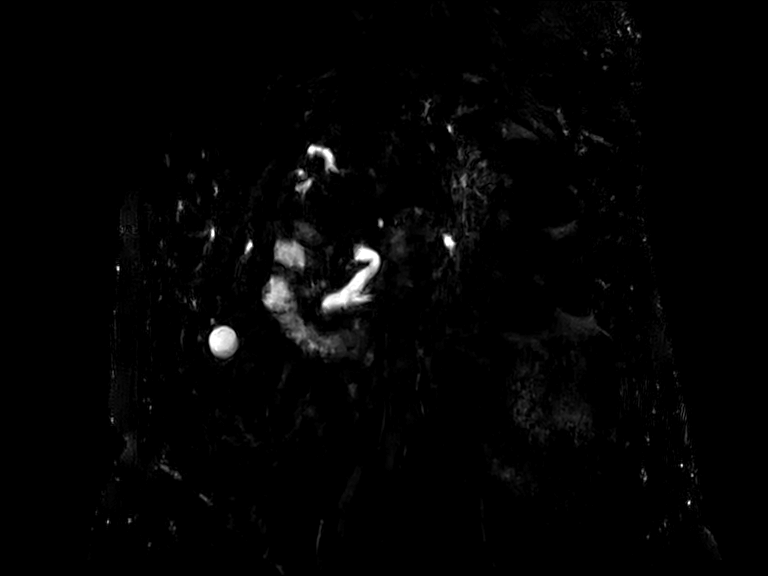
[im 57/72]
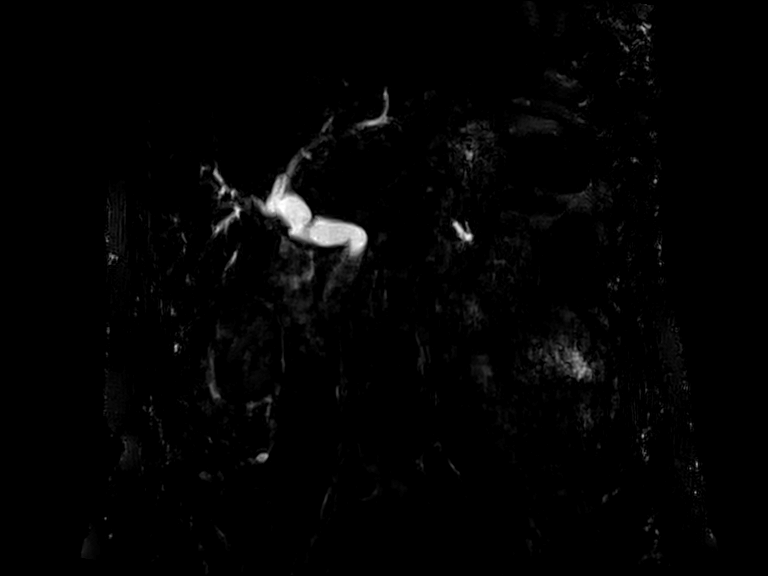
[im 72/72]
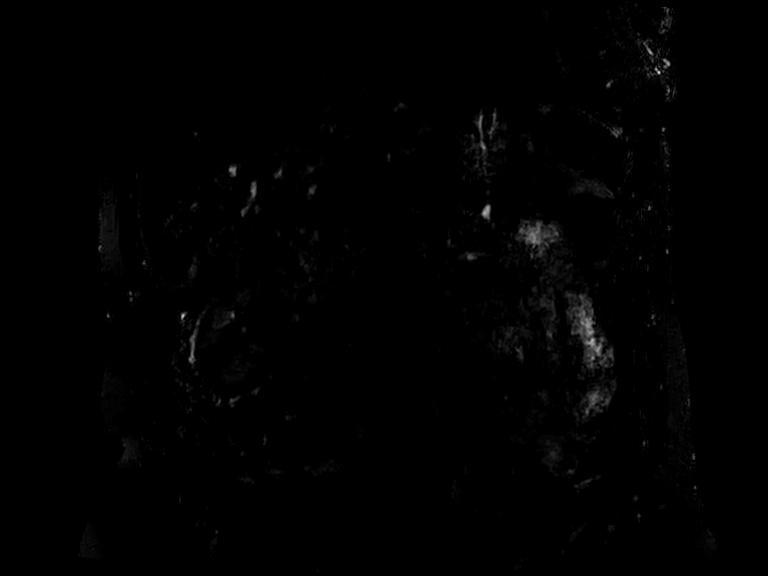

[Series 8: t2_space_cor_cs20_trig_384_iso_mip_radials · oblique · 0.49mm/px · 1 of 6 slices shown]
[im 1/6]
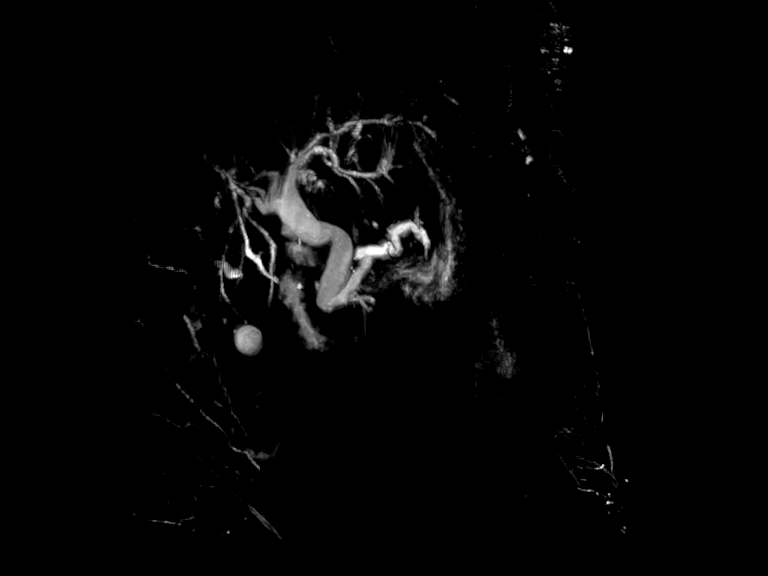

[Series 9: t1_vibe_(person_name)_pre_opp · axial · 3.0mm · 1.56mm/px · z∈[-109,+104]mm · 6 of 72 slices shown]
[im 1/72]
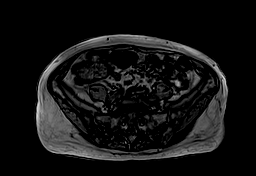
[im 15/72]
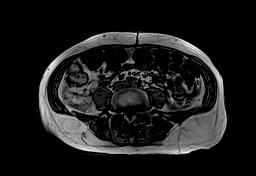
[im 29/72]
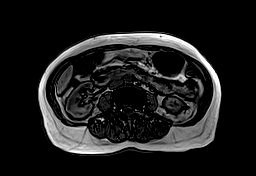
[im 43/72]
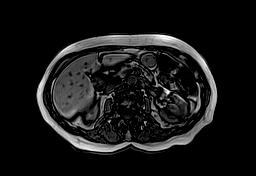
[im 57/72]
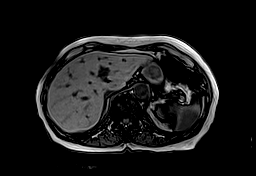
[im 72/72]
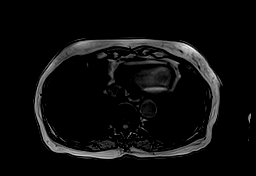

[Series 10: t1_vibe_(person_name)_pre_in · axial · 3.0mm · 1.56mm/px · z∈[-109,+104]mm · 6 of 72 slices shown]
[im 1/72]
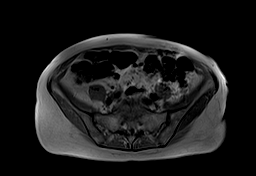
[im 15/72]
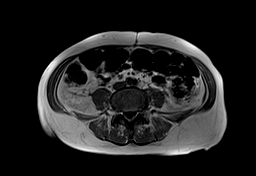
[im 29/72]
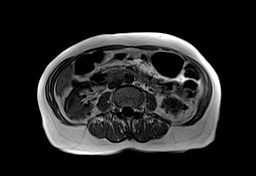
[im 43/72]
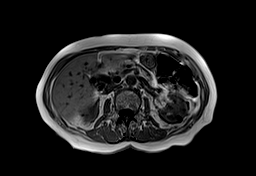
[im 57/72]
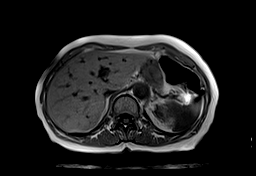
[im 72/72]
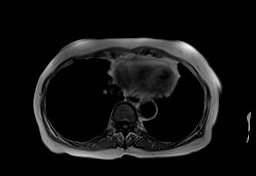

[Series 12: t1_vibe_(person_name)_pre_w · axial · 3.0mm · 1.56mm/px · z∈[-109,+104]mm · 6 of 72 slices shown]
[im 1/72]
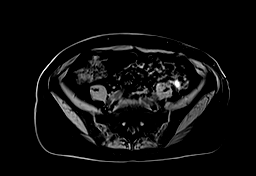
[im 15/72]
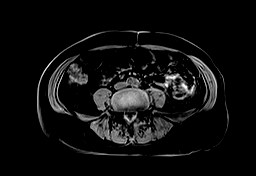
[im 29/72]
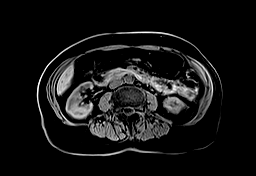
[im 43/72]
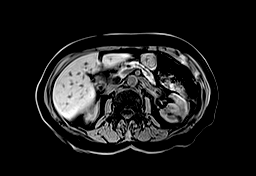
[im 57/72]
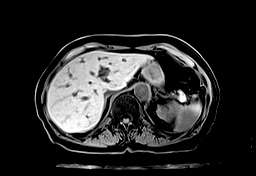
[im 72/72]
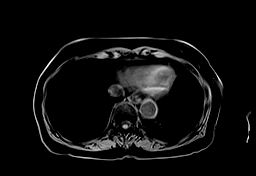

[Series 16: t1_vibe_axial_(person_name)_+c1_w · axial · 3.0mm · 1.56mm/px · z∈[-109,+104]mm · 6 of 72 slices shown]
[im 1/72]
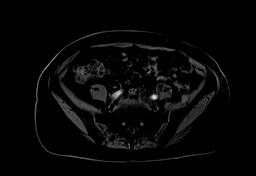
[im 15/72]
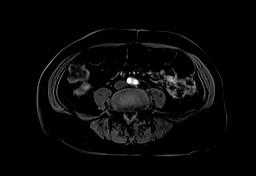
[im 29/72]
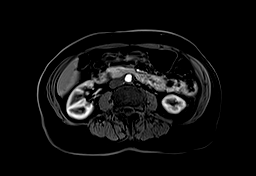
[im 43/72]
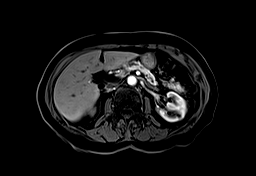
[im 57/72]
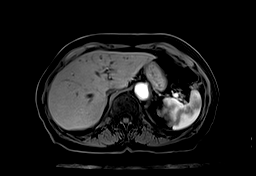
[im 72/72]
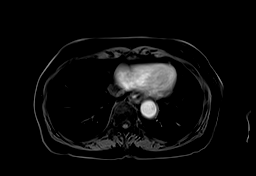

[Series 20: t1_vibe_axial_(person_name)_+c2_w · axial · 3.0mm · 1.56mm/px · z∈[-109,-25]mm · 3 of 72 slices shown]
[im 1/72]
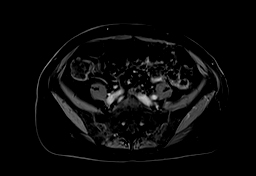
[im 15/72]
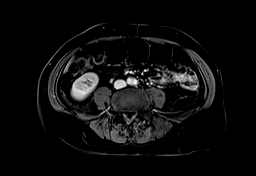
[im 29/72]
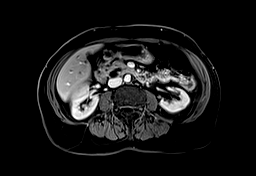

[39 of 48 positions shown; findings below may reference images not displayed]

REASON FOR EXAM

*
Abnormal findings on diagnostic imaging of other parts of digestive tract

COMPARISON

*
CT abdomen, pelvis 10/13/2020

TECHNIQUE

*
MR images of the abdomen were acquired before and after the administration of 15 mL ProHance IV contrast

*
Heavily T2 weighted MRCP images were acquired

*
3-D rotating MIP images were generated with post-processing software under concurrent physician supervision

FINDINGS

Biliary Ducts

*
Maximum diameter of the common bile/common hepatic ducts is 16 mm

*
No mural irregularity

*
No rounded filling defects in the extra-hepatic bile ducts

*
Mild intrahepatic biliary dilatation

*
Gallbladder not visualized

Pancreatic Ducts

*
Minor duct is not well seen on this exam

*
Ventral duct inserts on the ampulla, just anterior to the distal common bile duct

*
Maximum diameter of the main pancreatic duct is 6 mm

*
No ductal stricture

*
Duct is mildly tortuous, but does not have a beaded appearance

Pancreatic Parenchyma

*
Prominent side branch ducts

*
No significant solid lesion

*
Age-appropriate parenchymal volume loss

Liver

*
Smooth margins

*
Normal size

*
No steatosis

*
No lesion

Portal

*
Patent portal vein, splenic vein, and SMV

*
Patent hepatic veins

*
No ascites

*
No significant collateral veins

Kidneys and Adrenals

*
Small renal cysts

Lymph Nodes

*
No suspicious lymph nodes

Additional

*
No malignant osseous enhancement

*
Minimal scarring in the lung bases

IMPRESSION

*
Negative exam for choledocholithiasis or biliary stricture. Dilated bile ducts are probably due to cholecystectomy and advancing age

*
Main pancreatic duct diameter is also just greater than normal, also most likely due to advancing age. However, as this is one of the worrisome features for main duct IPMN, six-month follow-up MRCP is suggested

## 2021-01-07 IMAGING — OT DXA BONE DENSITY
2 series · 2 of 2 positions shown · non-contrast
Comparison: none

REASON FOR EXAM: Encounter for screening for osteoporosis.

RISK FACTORS:  COPD.
PRIOR EXAMS:  None.
METHOD:  Scans of the lumbar spine between L1-L4 and left hip were performed using dual energy X-ray densitometry (DXA)

[Series 1: — · left · 1 of 1 slices shown (1 of 2)]
[im 1/1]
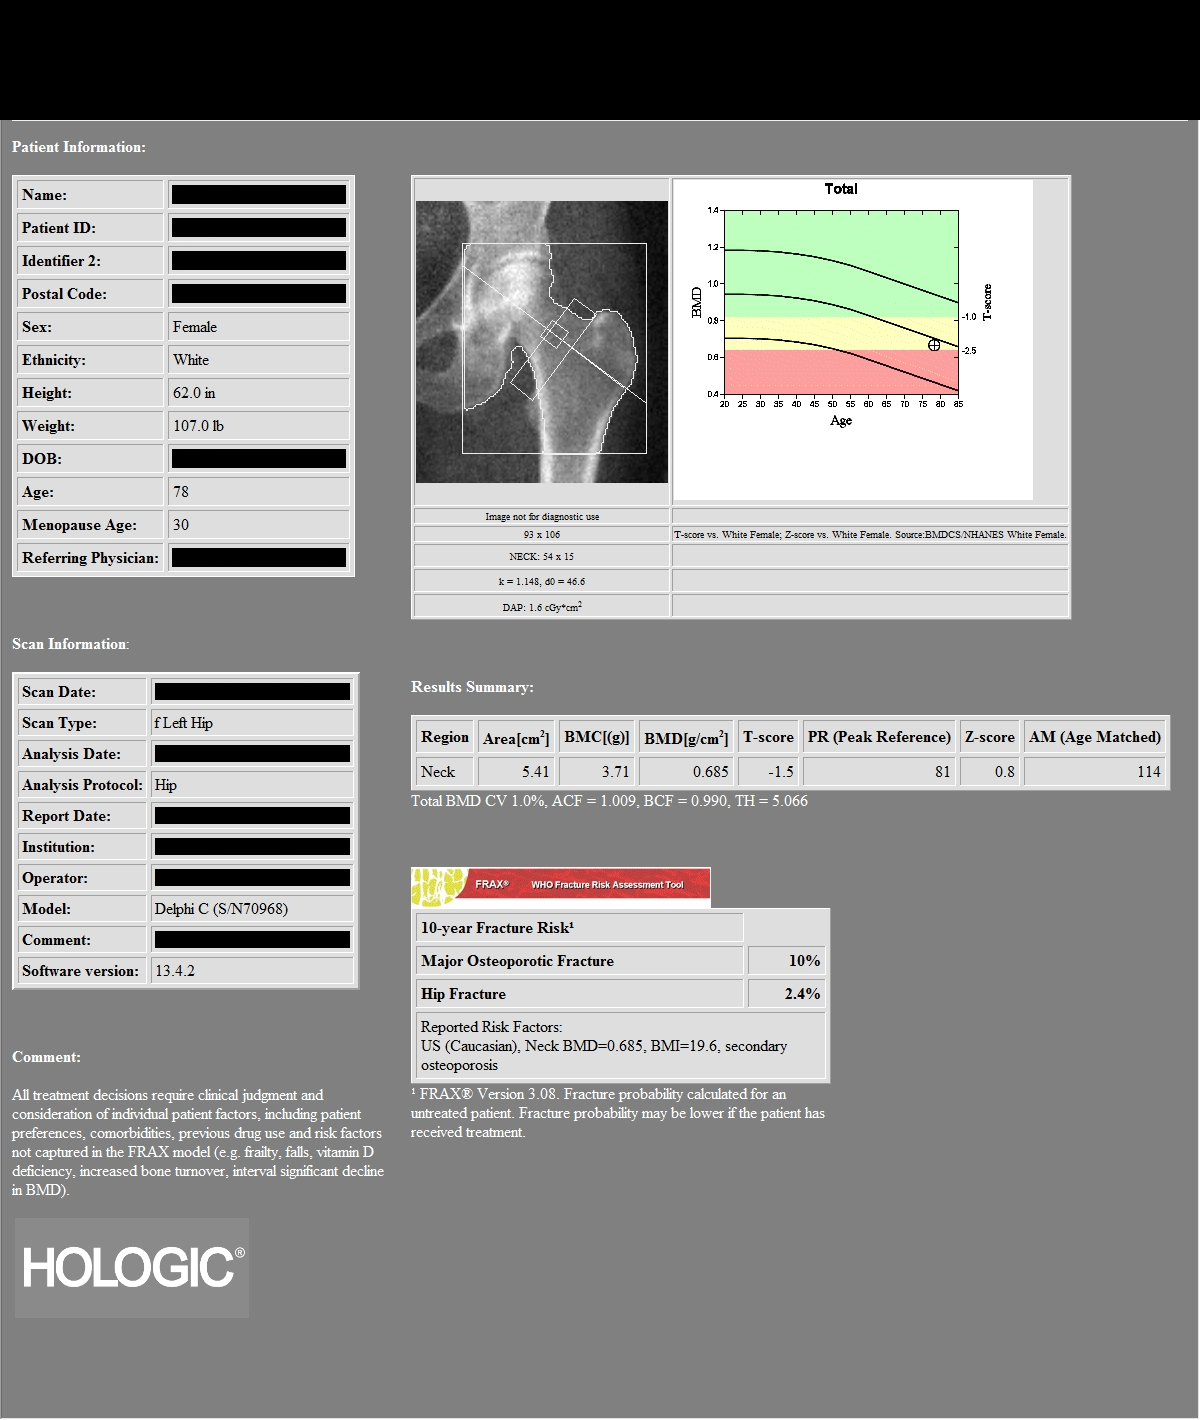

[Series 2: — · 1 of 1 slices shown (2 of 2)]
[im 1/1]
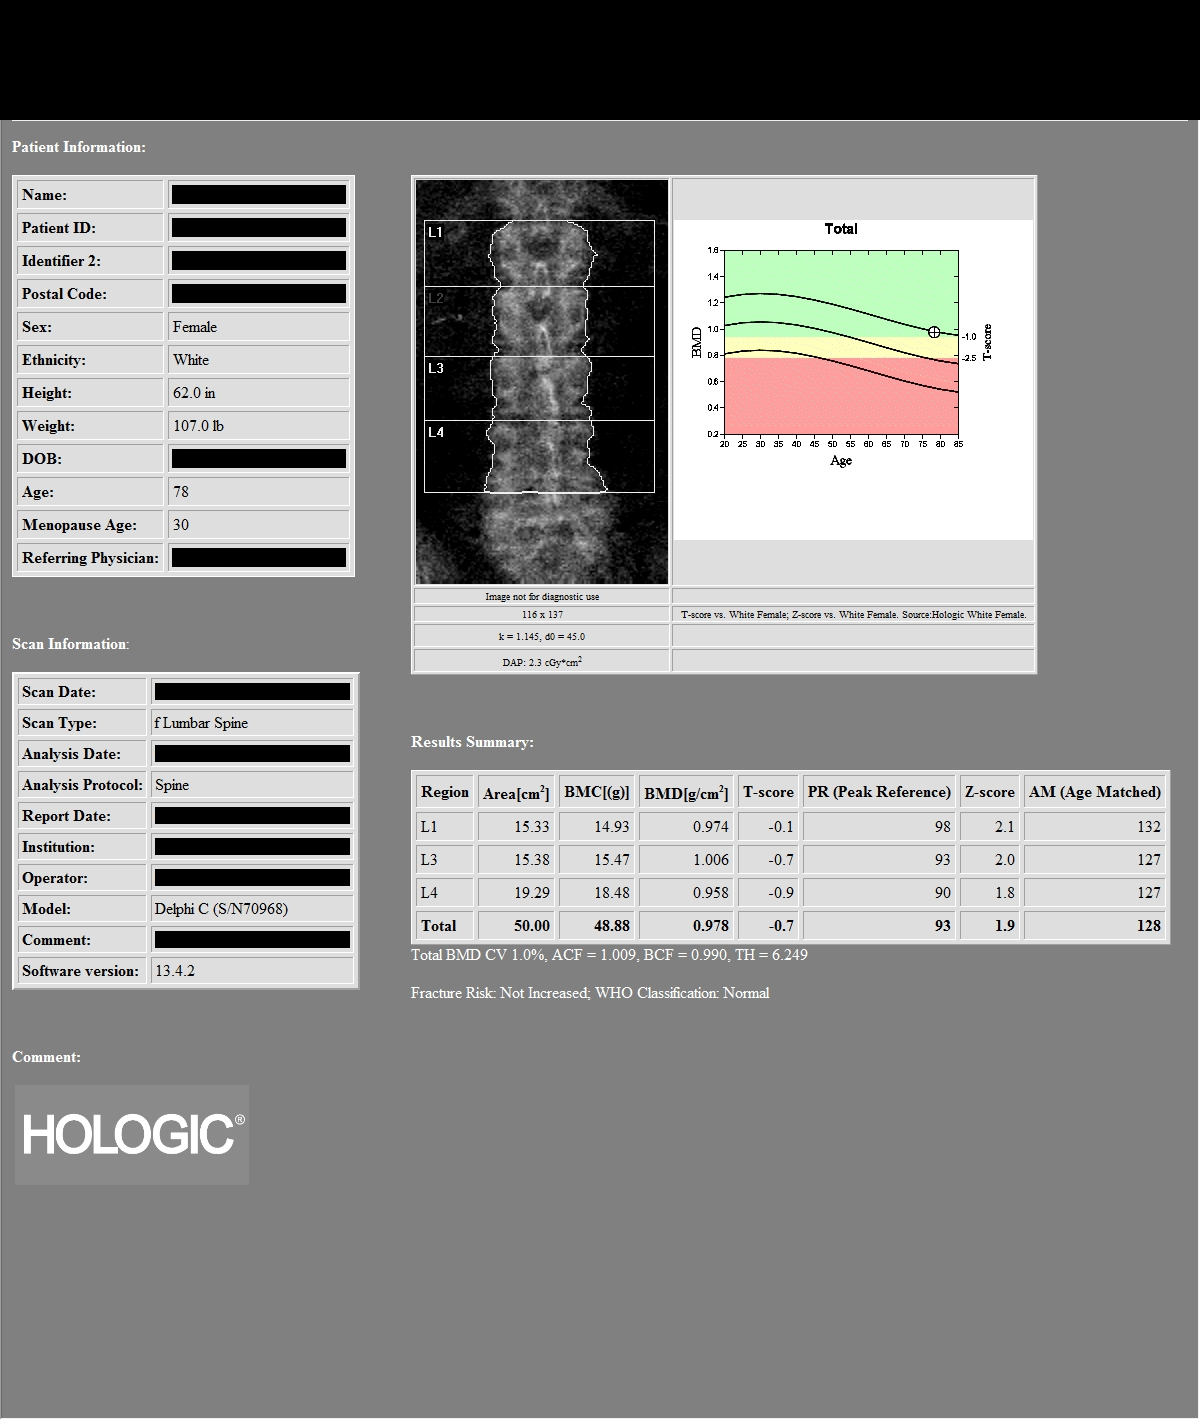

[2 of 2 positions shown; findings below may reference images not displayed]

IMPRESSION: As defined by World Health Organization, the patient meets the criteria for OSTEOPENIA based on left hip T-score.

PATIENT DEMOGRAPHICS: 80-year-old white female.
FINDINGS: 1.    Review of scanogram images shows the L2 vertebral body is excluded from analysis secondary to artifact.  

2.    The lumbar spine exam using L1,L3-L4 regions shows average Bone Mineral Density is 0.978 gm/cm2 of Hydroxyapatite.  The T-score (comparing patient with a young adult group) is 0.7 standard deviations BELOW mean. The Z-score (comparing patient with an age-matched group) is 1.9 standard deviations ABOVE mean.

3.  The left hip exam using femoral neck region of interest shows average Bone Mineral Density is 0.685 gm/cm2 of Hydroxyapatite. The T-score (comparing patient with a young adult group) is 1.5 standard deviations BELOW mean. The Z-score (comparing patient with an age-matched group) is 0.8 standard deviations ABOVE mean.

According to the World Health Organization risk assessment tool (FRAX) for osteopenia only, which uses the femoral neck T-score and includes other patient risk factors for fracture, the patient has a 10-year absolute risk of hip fracture of 2.4% and 10-year absolute risk fracture for any major fracture of 10%.

RECOMMENDATIONS:  The patient states that she is taking supplements on a regular basis.  The patient should continue being a non-smoker and regular exercise to patient tolerance would be of benefit.  The patient is currently not taking prescribed medication for prevention of bone loss.  According to criteria established by the National Osteoporosis Foundation, the patient DOES NOT meet the current indications for prescribed medical therapy.  The National Osteoporosis Foundation now recommends followup DXA scanning every two years in patients at risk regardless of whether the patient is undergoing pharmacological treatment.

World Health Organization criteria for diagnosis, please see link below.

[URL]

## 2021-04-19 IMAGING — CR CHEST 2 VWS PA LAT
1 series · 2 of 2 positions shown · non-contrast
Comparison: None

HISTORY: Chronic obstructive pulmonary disease, unspecified
TECHNIQUE: PA and lateral chest radiographs

[Series 1: pa · 0.17mm/px · 2 of 2 slices shown]
[im 1/2]
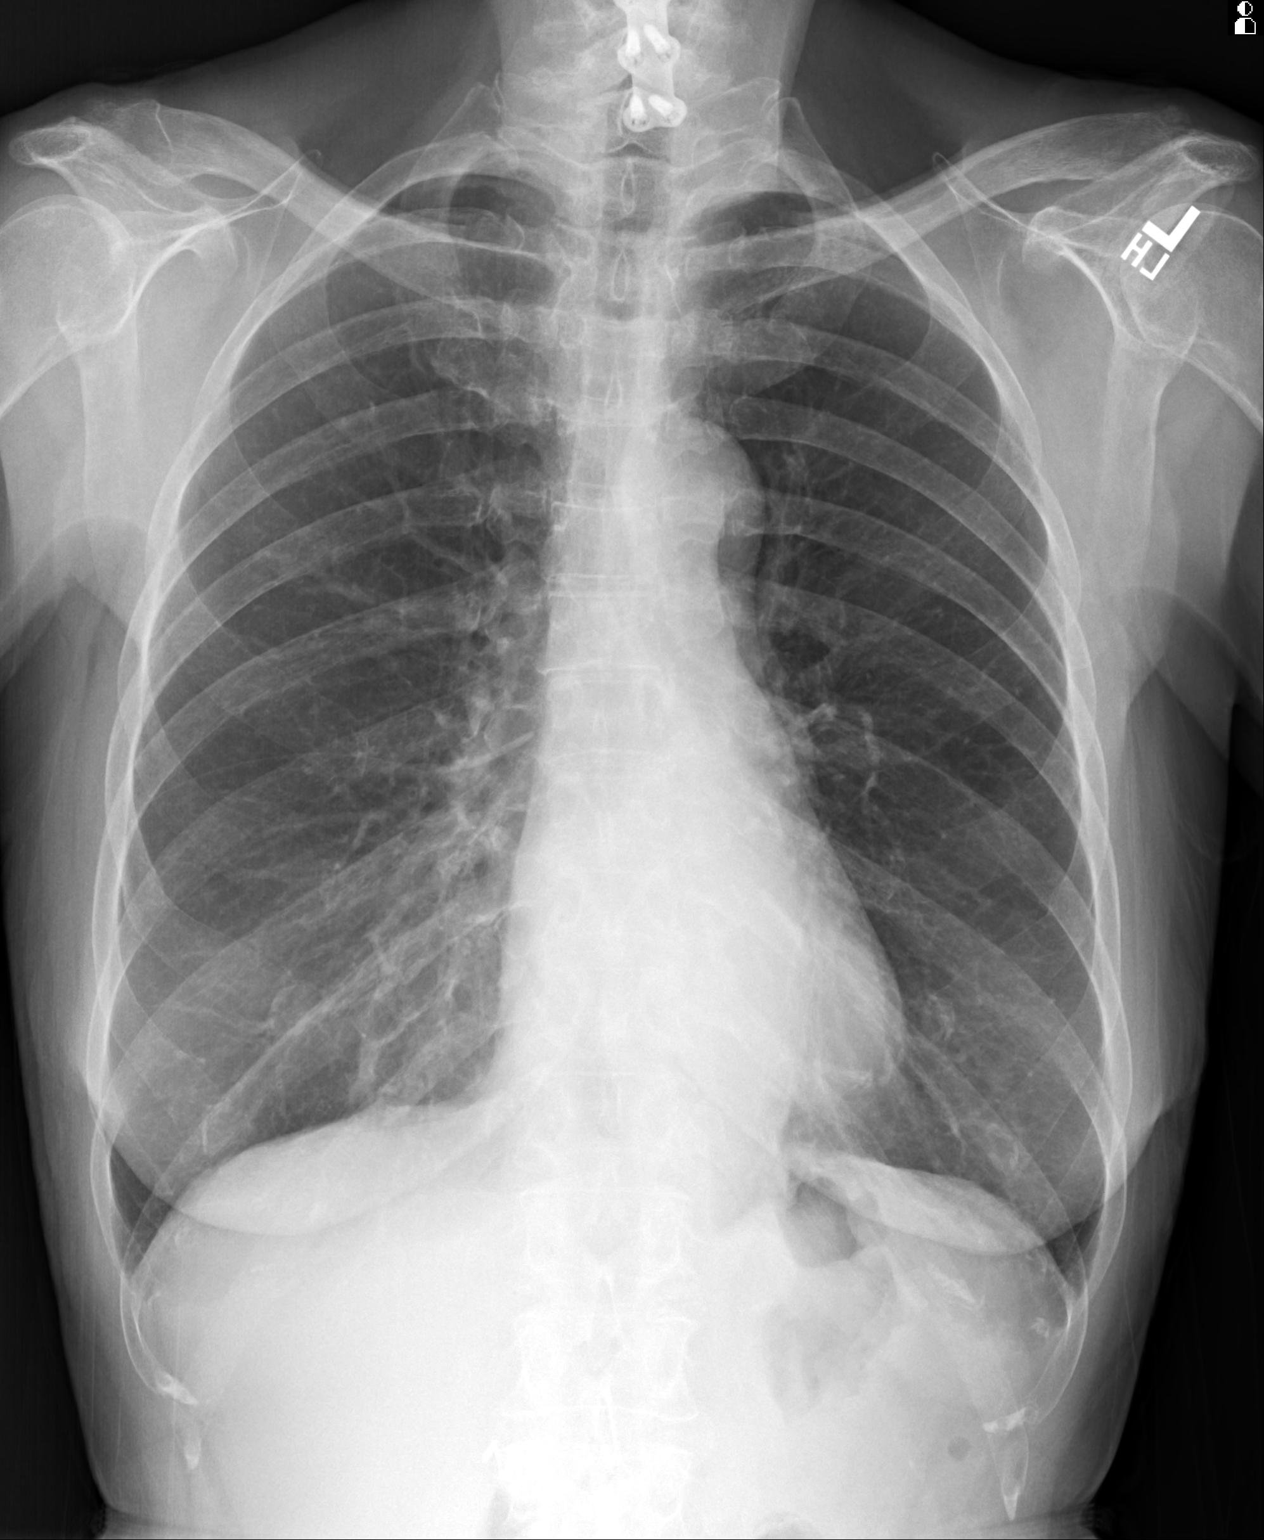
[im 2/2]
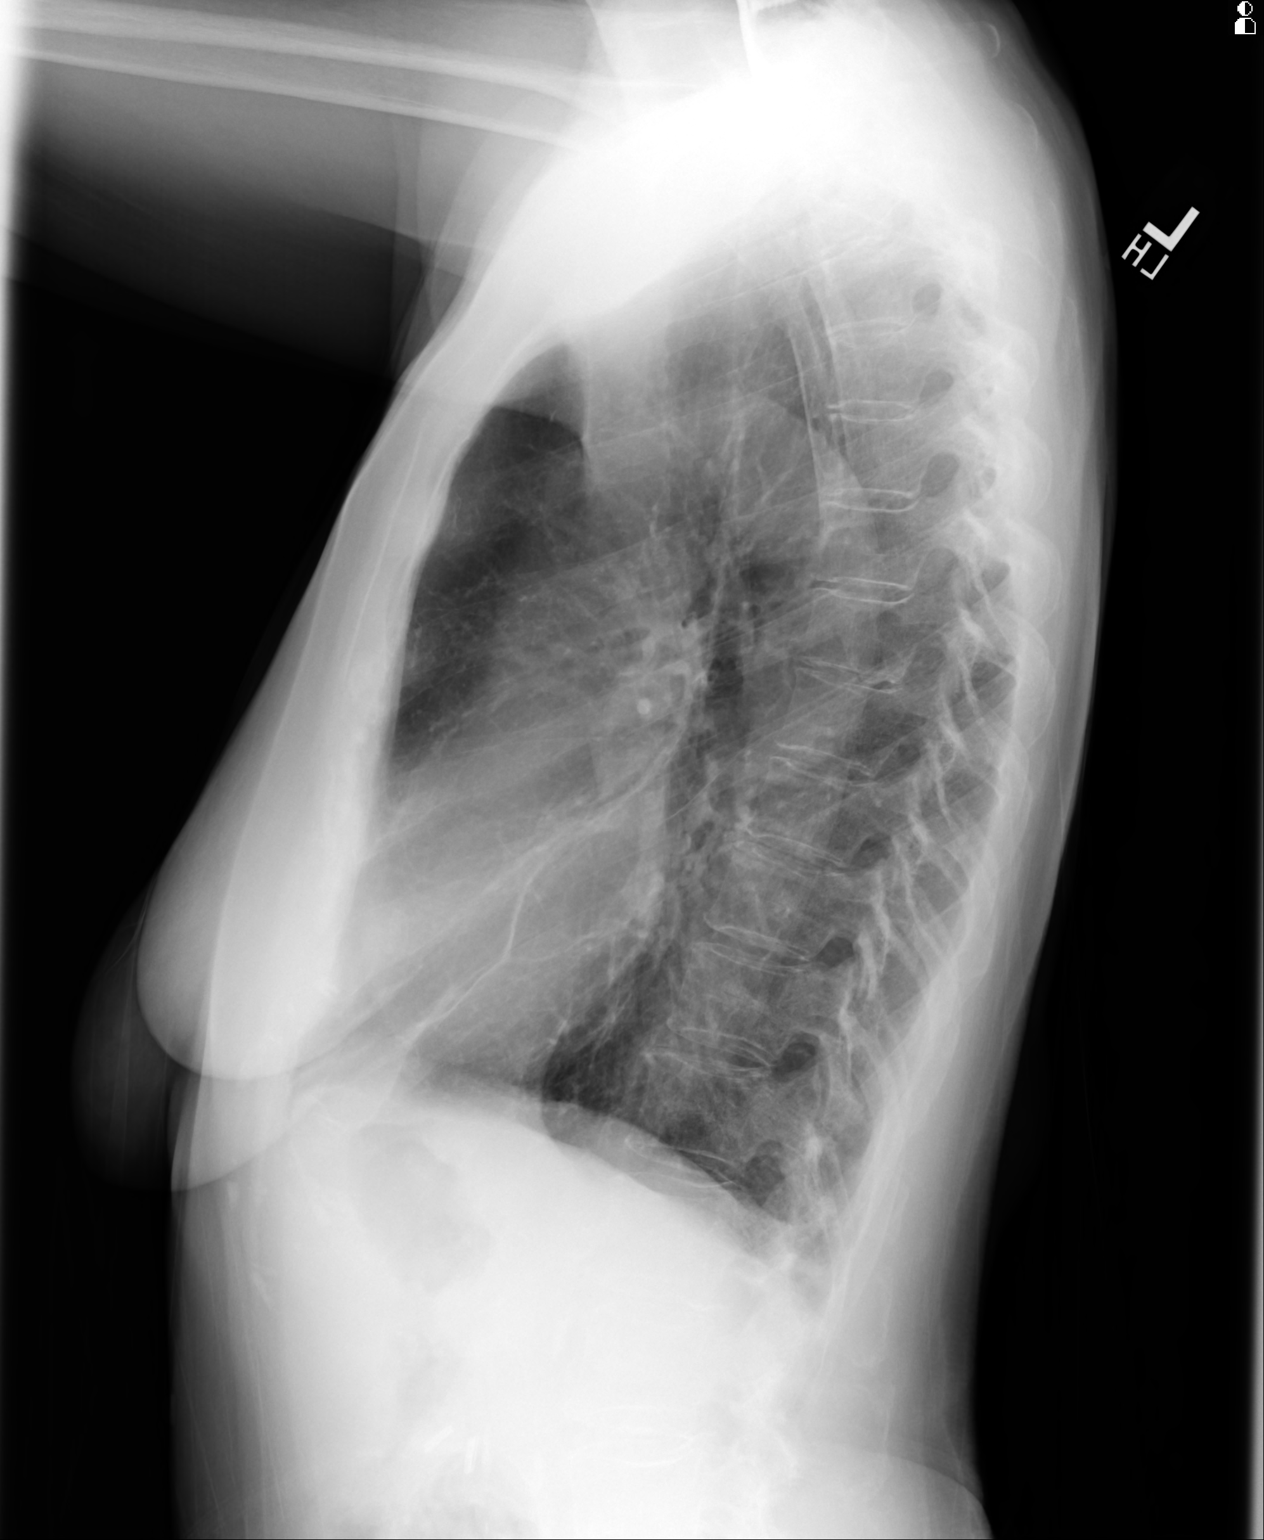

[2 of 2 positions shown; findings below may reference images not displayed]

FINDINGS: Heart size is normal. No pleural effusion or pneumothorax. No consolidation. Hyperextended lungs, suggesting COPD. Fusion hardware in the cervical spine, partially imaged.
IMPRESSION: 1.
No acute chest findings.

2.
COPD.

## 2021-06-03 IMAGING — MR MRCP (MR Cholangiogram w/Reconstructions)
6 of 9 series · 30 of 48 positions shown · IV contrast (prohance)
Comparison: MR abdomen, 11/23/20

Images Obtained from [HOSPITAL] Imaging
HISTORY/INDICATION:  Dilated pancreatic duct
TECHNIQUE: Scans of the abdomen using biliary scanning protocol are performed in the coronal and transaxial planes using T1-2, T2 fat-suppressed, T1, in and out of phase and dynamic enhanced T1
Mital scans following intravenous administration of 15 cc of ProHance. 3D rotating MIP images were generated with post-processing software.

[Series 2: t2_cor · coronal · 6.0mm · 1.25mm/px · 3 of 26 slices shown]
[im 1/26]
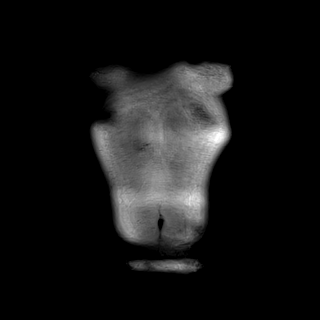
[im 13/26]
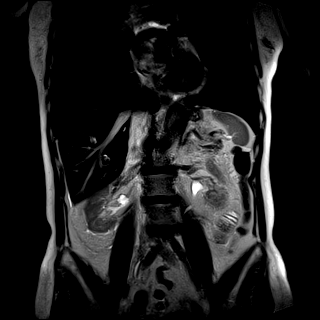
[im 26/26]
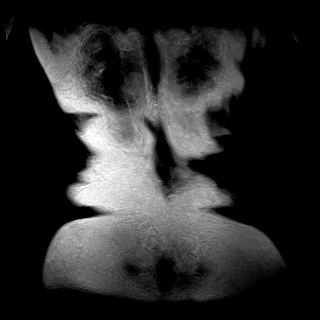

[Series 3: t2_axial_fs_bh · axial · 6.0mm · 1.19mm/px · z∈[-95,+93]mm · 2 of 27 slices shown]
[im 1/27]
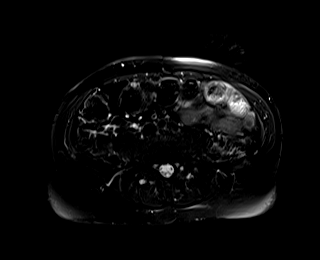
[im 27/27]
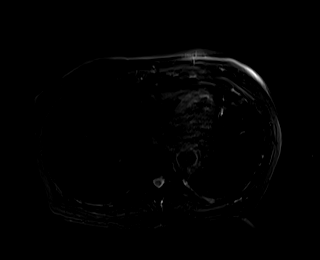

[Series 7: t2_space_cor_cs20_trig_384_iso · coronal · 1.0mm · 0.49mm/px · 7 of 72 slices shown]
[im 1/72]
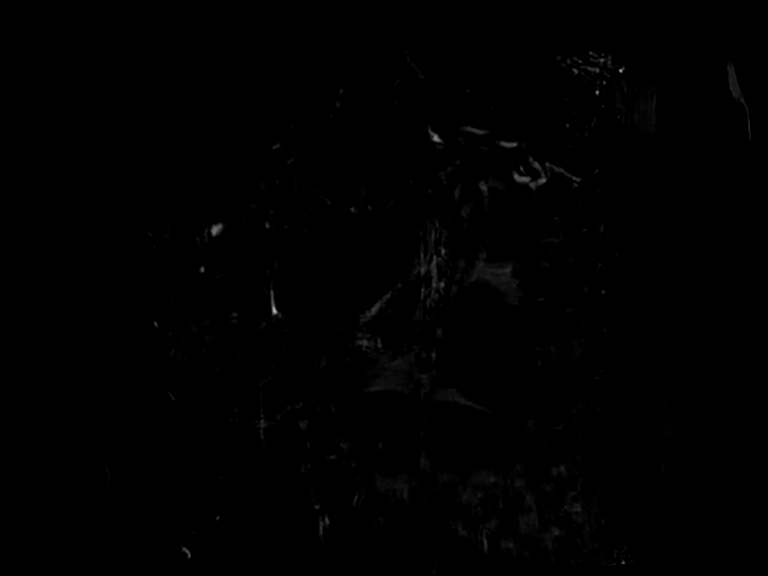
[im 12/72]
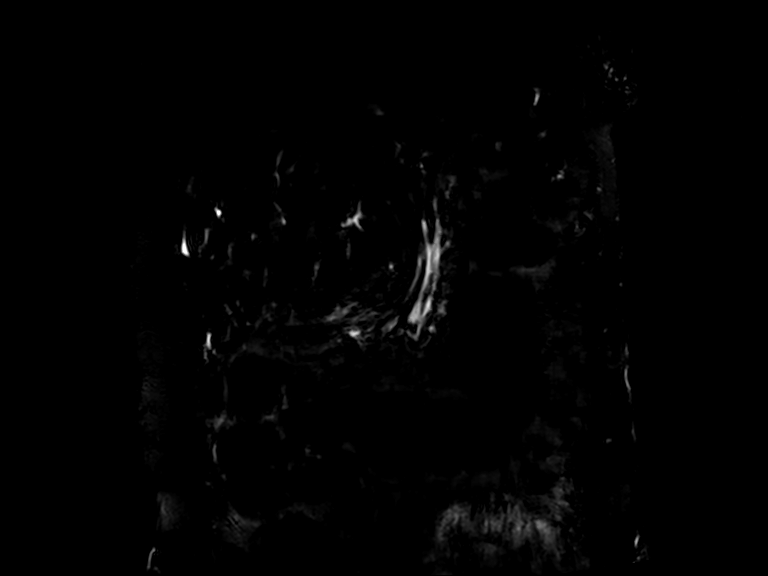
[im 24/72]
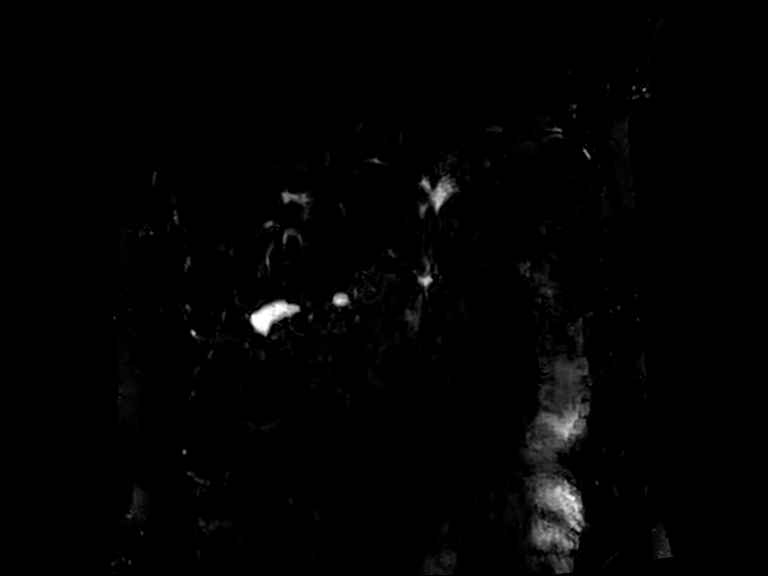
[im 36/72]
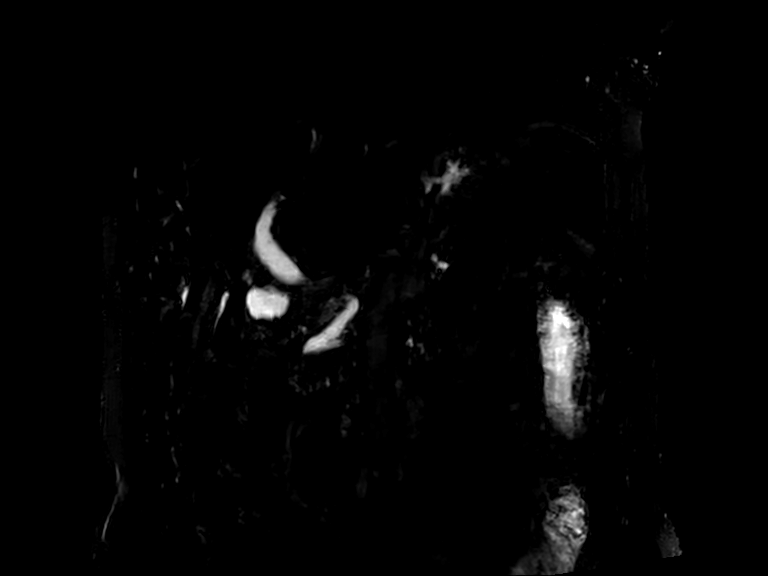
[im 48/72]
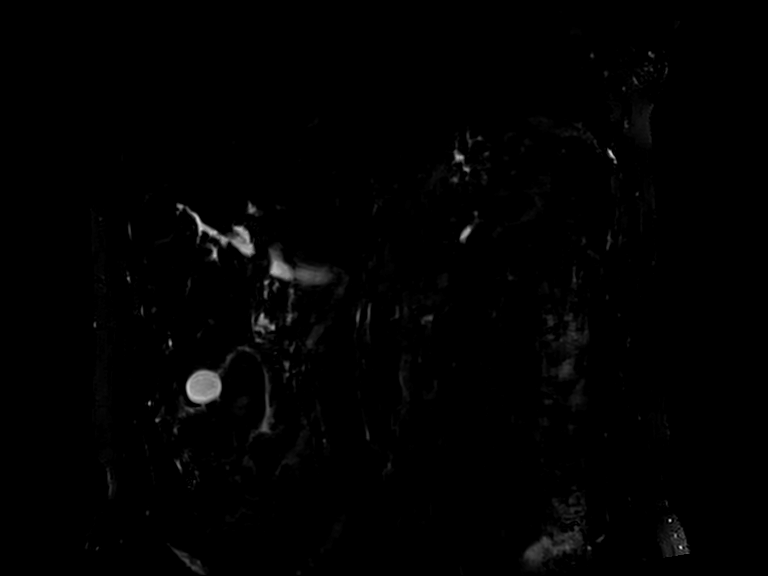
[im 60/72]
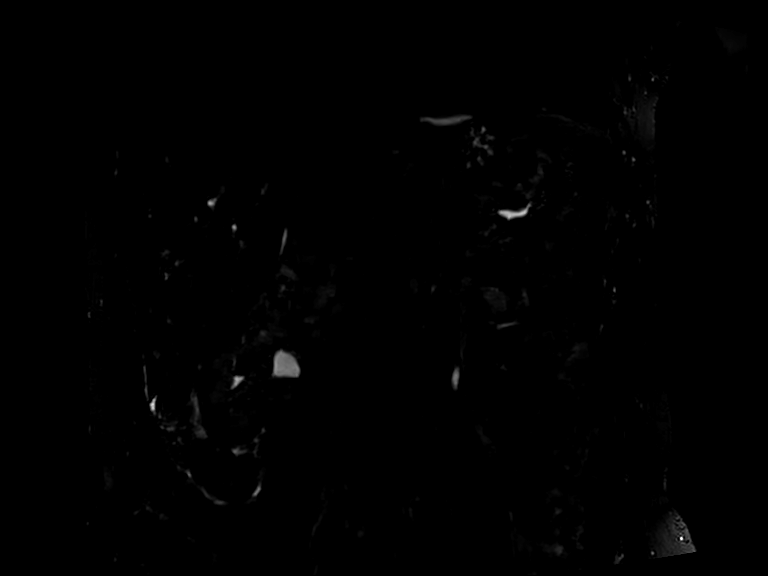
[im 72/72]
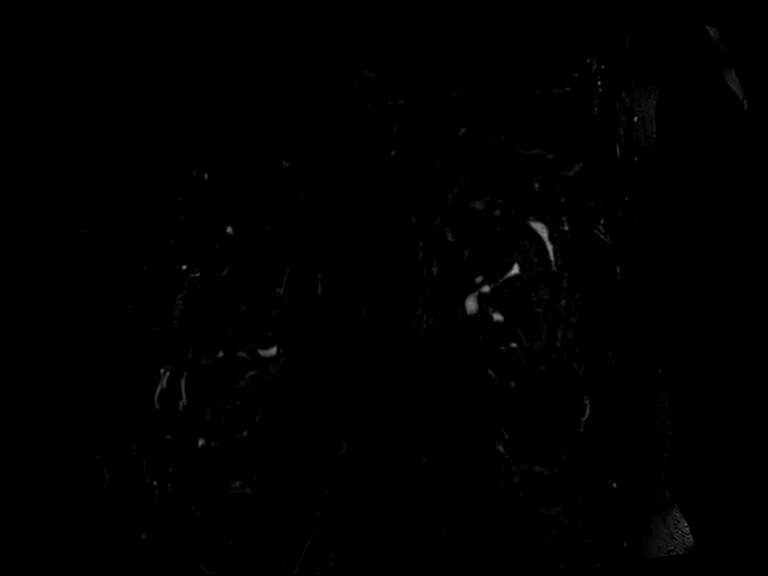

[Series 10: t1_vibe_(person_name)_pre_opp · axial · 3.0mm · 1.56mm/px · z∈[-98,+97]mm · 6 of 66 slices shown]
[im 1/66]
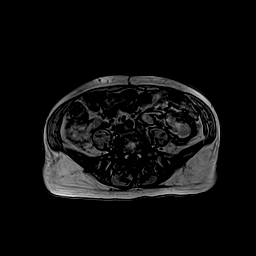
[im 14/66]
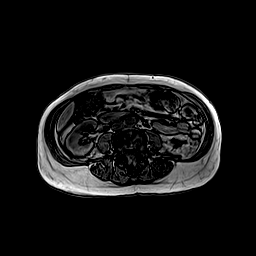
[im 27/66]
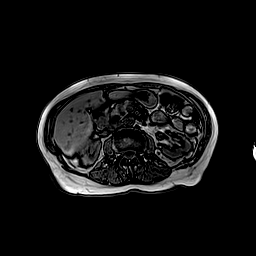
[im 40/66]
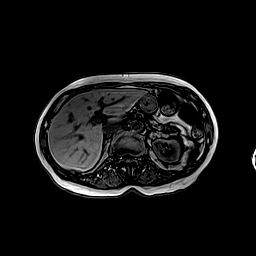
[im 53/66]
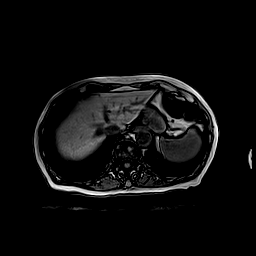
[im 66/66]
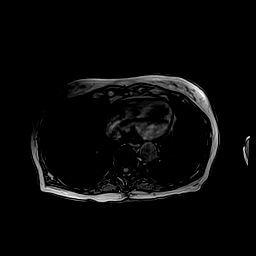

[Series 11: t1_vibe_(person_name)_pre_in · axial · 3.0mm · 1.56mm/px · z∈[-98,+97]mm · 6 of 66 slices shown]
[im 1/66]
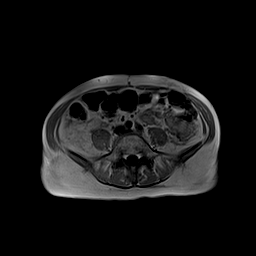
[im 14/66]
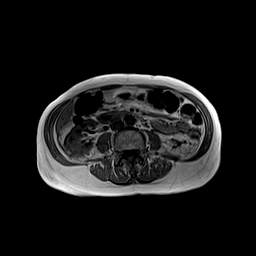
[im 27/66]
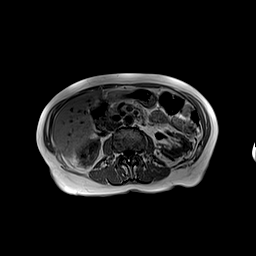
[im 40/66]
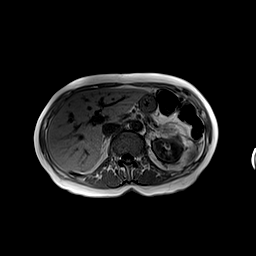
[im 53/66]
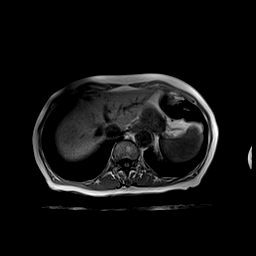
[im 66/66]
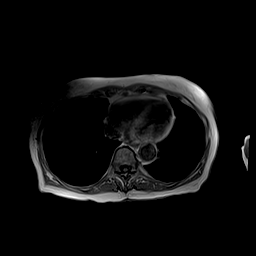

[Series 13: t1_vibe_(person_name)_pre_w · axial · 3.0mm · 1.56mm/px · z∈[-98,+97]mm · 6 of 66 slices shown]
[im 1/66]
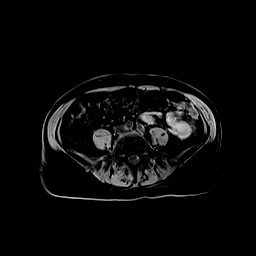
[im 14/66]
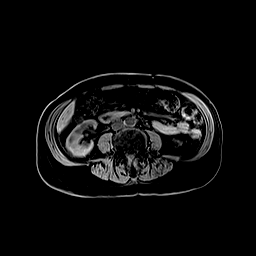
[im 27/66]
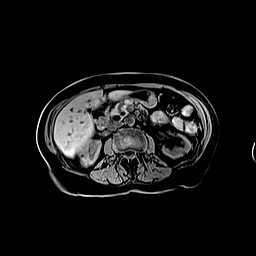
[im 40/66]
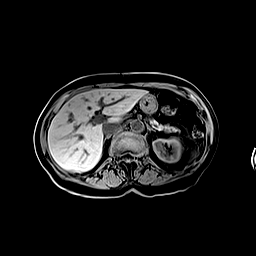
[im 53/66]
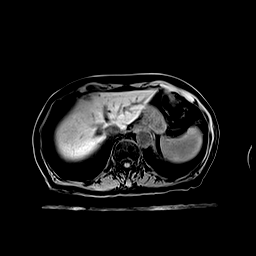
[im 66/66]
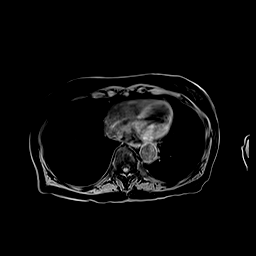

[30 of 48 positions shown; findings below may reference images not displayed]

FINDINGS: Lung bases are clear. No hepatic findings are present other than for mild intrahepatic bile duct dilatation similar to prior exam. Portal and hepatic veins are normal.
Ferromagnetic artifact from surgical clips left as sequela of prior cholecystectomy are seen. The common bile duct remains significantly dilated measuring 14 mm in diameter, similar to prior exam,
with moderate tortuosity of the duct. The distal duct segment on MRCP appears normal with no evidence of obstruction by stone or periampullary pancreatic neoplasm.
The pancreatic duct also remains significantly dilated, which in the distal duct segment again measures up to 6 mm in diameter with no ductal obstruction or mass or mass in the periampullary region
of pancreas. There is mild fatty involution of pancreas showing no change from prior exam.
Spleen, adrenal glands and kidneys are normal except for small 10 mm or less simple cysts in the right and left kidney. The periaortic and paracaval spaces are normal and abnormal bone findings are
present on study.
IMPRESSION: 1.  Stable intrahepatic and extrahepatic bile duct dilatation with tortuosity of the common bile duct and dilated pancreatic duct without evidence of obstruction or periampullary pancreatic neoplasm.
There are no findings of IPMN.
2.  Duct dilatation is probably due to patient age and postcholecystectomy state but further evaluation (ERCP) is indicated if the patient has abnormal lab suggesting biliary obstruction.
3.  No significant change in small bilateral renal cysts.
4.  No acute abdominal findings are present.

## 2021-07-19 IMAGING — CR C-SPINE 4 or 5 views
1 series · 6 of 6 positions shown · non-contrast
Comparison: None.

Images Obtained from [facility]
C-spine 5 views 6 images
INDICATIONS: Paresthesia of skin

[Series 1: ap · 0.17mm/px · 6 of 6 slices shown]
[im 1/6]
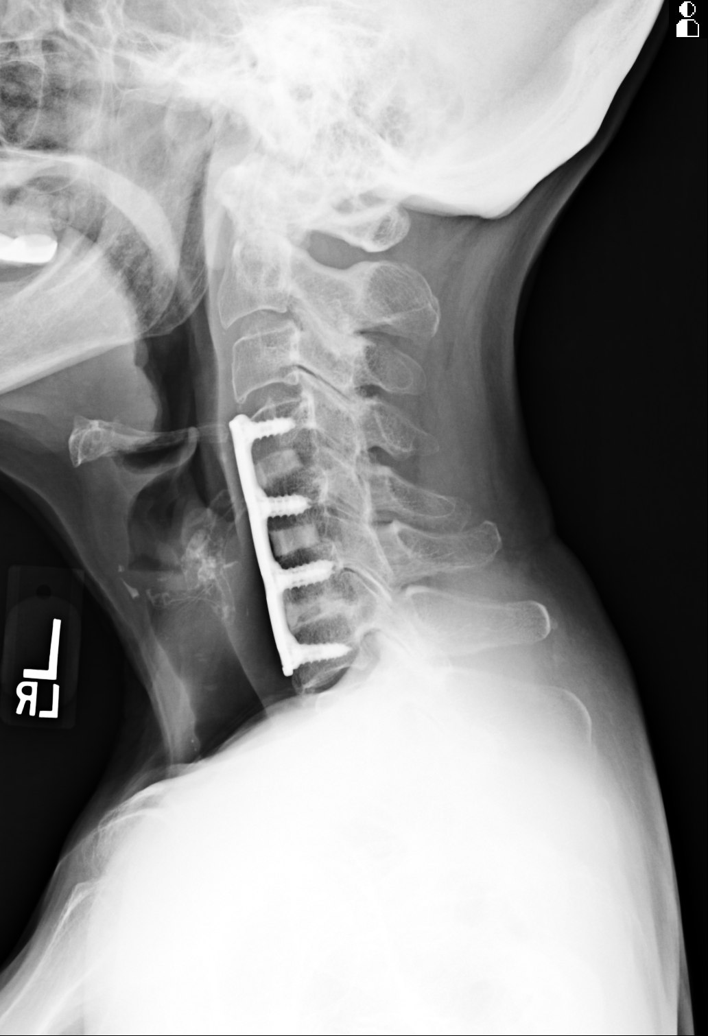
[im 2/6]
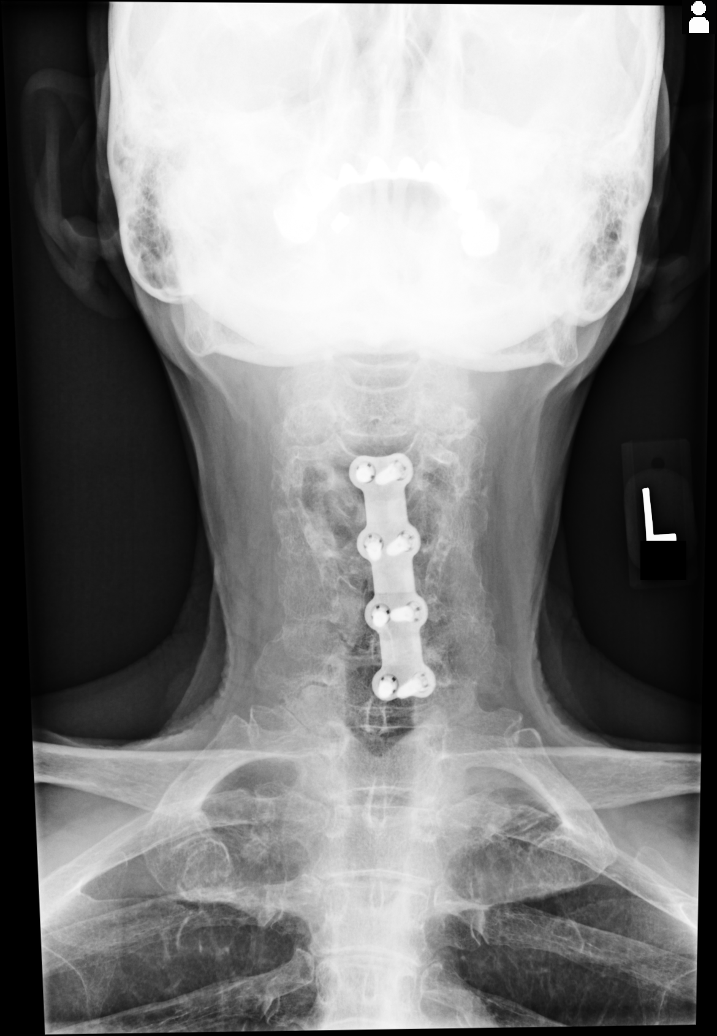
[im 3/6]
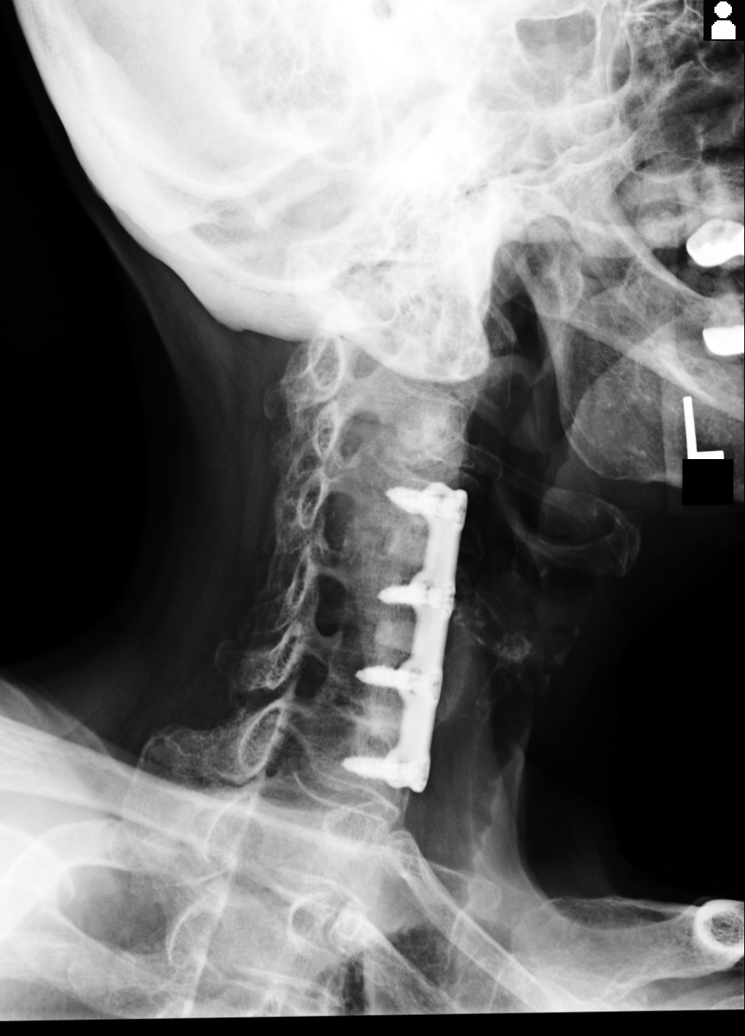
[im 4/6]
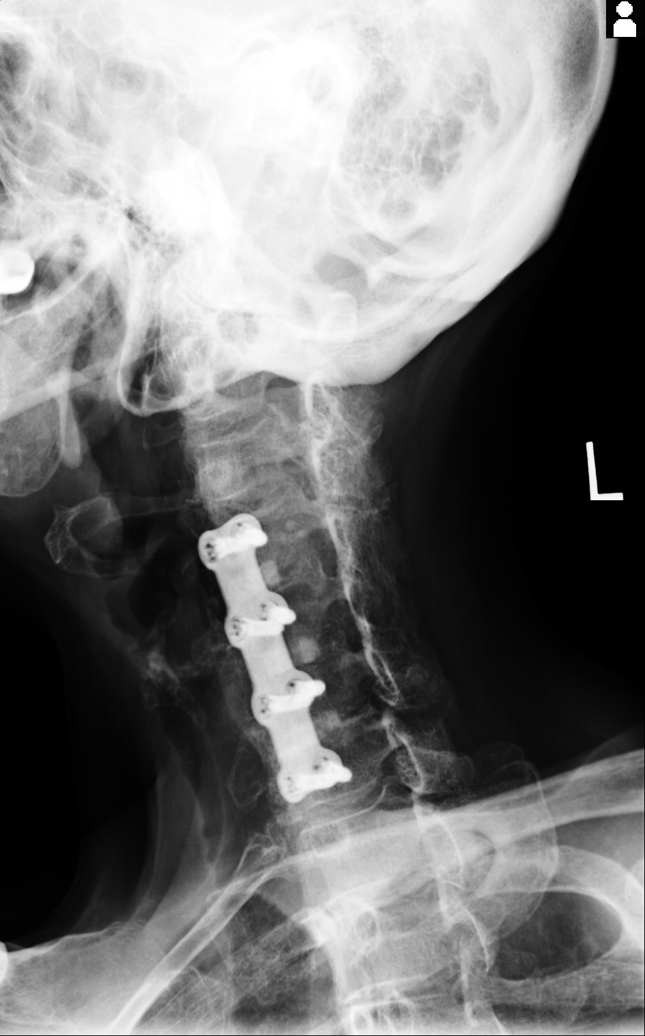
[im 5/6]
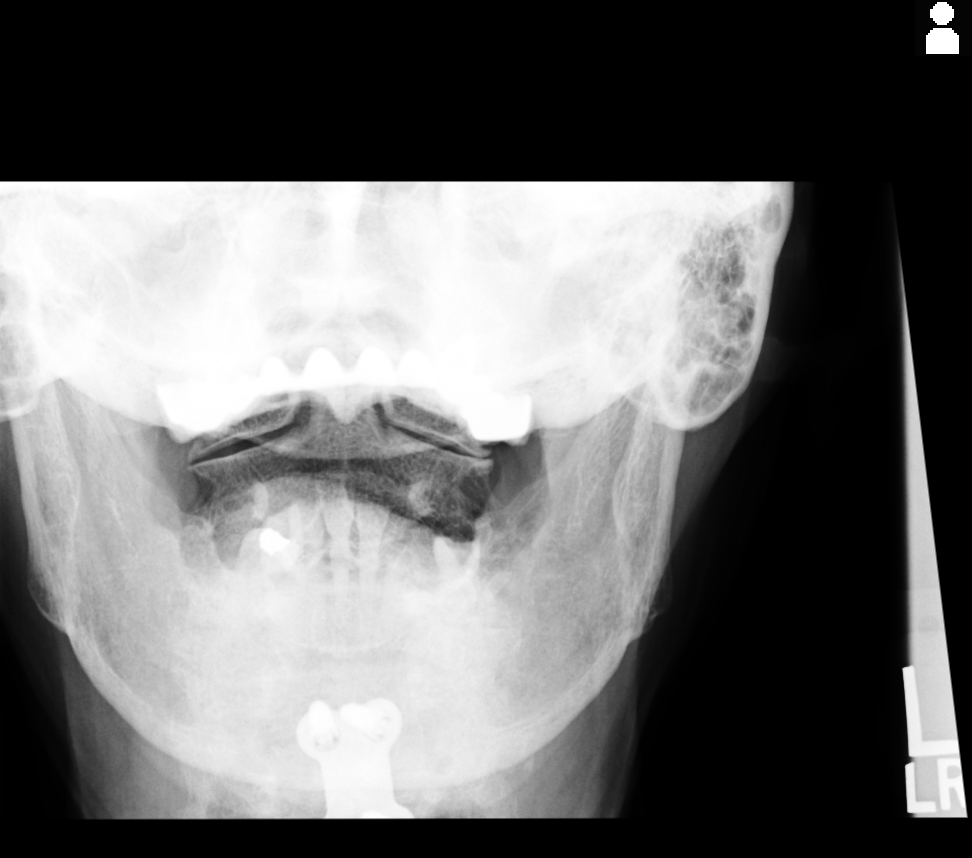
[im 6/6]
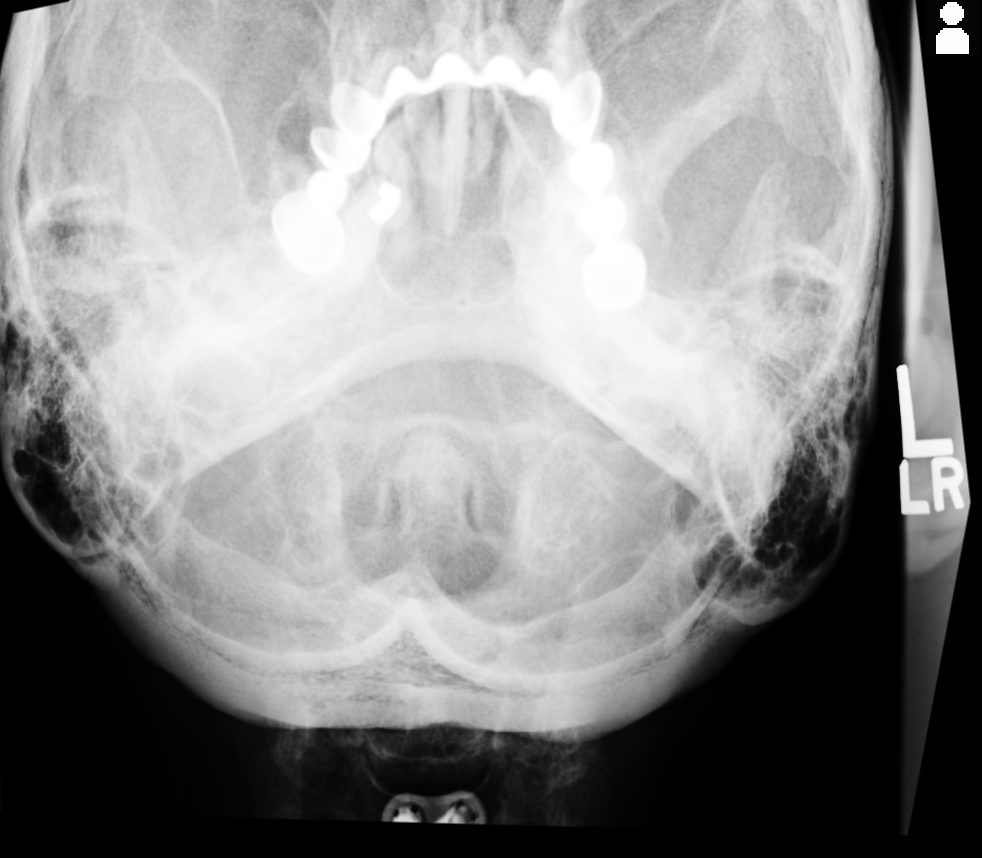

[6 of 6 positions shown; findings below may reference images not displayed]

FINDINGS: C4-7 interdisc and anterior plate/screw fusion. No sign of loosening, subluxation or prevertebral soft tissue swelling. The levels cephalad look unremarkable other than moderate C2-3 facet
arthropathy with mild foraminal stenoses resulting. Intact odontoid. Normal mineralization.
IMPRESSION: No acute disease.

## 2021-07-25 IMAGING — US DOP CAROTID BILATERAL
1 series · 13 of 24 positions shown · non-contrast
Comparison: None

Images Obtained from [facility]
HISTORY: 80-year-old female with other specified symptoms and signs involving the circulatory and respiratory systems
TECHNIQUE: Gray-scale color Doppler and spectral Doppler imaging of the bilateral carotid and vertebral arteries is performed.

[Series 1: dop carotid bilateral · 13 of 52 slices shown]
[im 1/52]
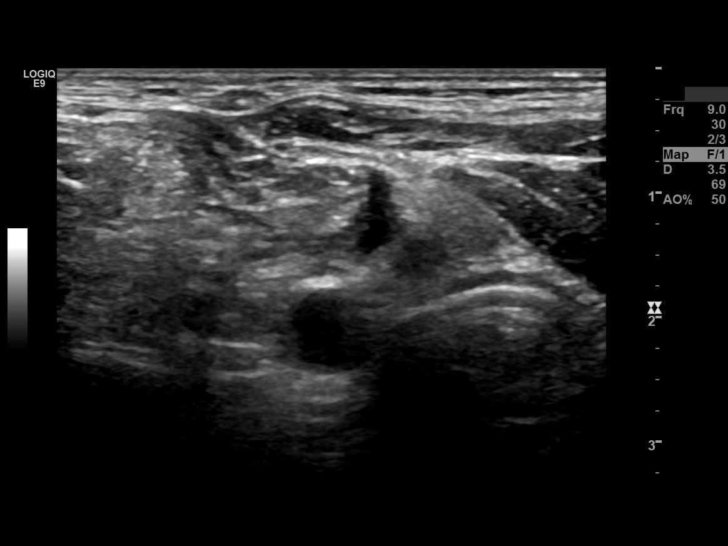
[im 5/52]
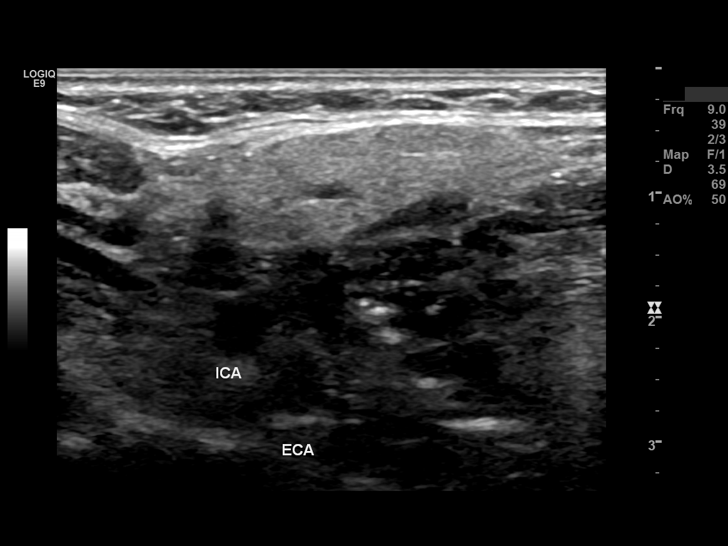
[im 9/52]
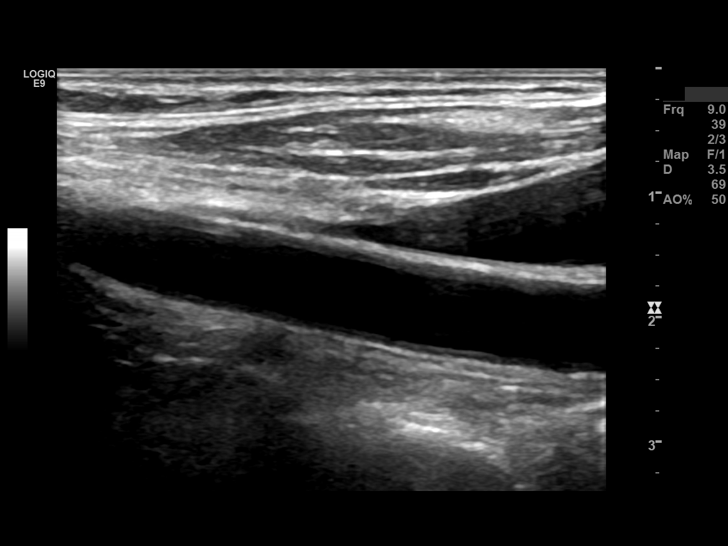
[im 14/52]
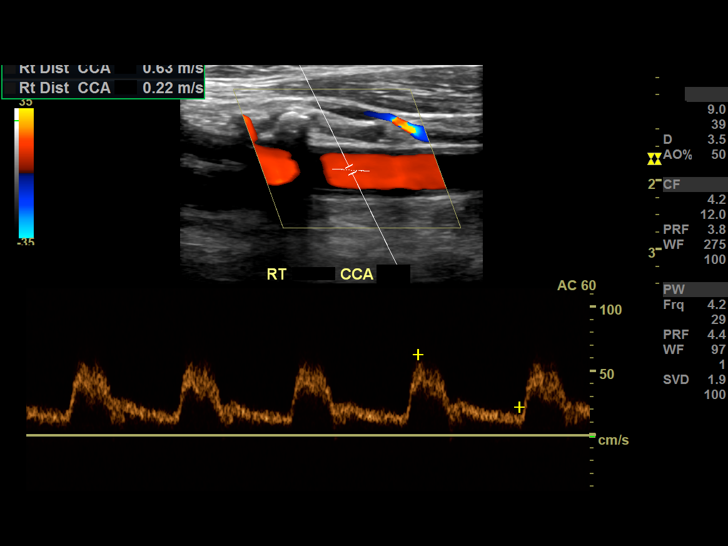
[im 18/52]
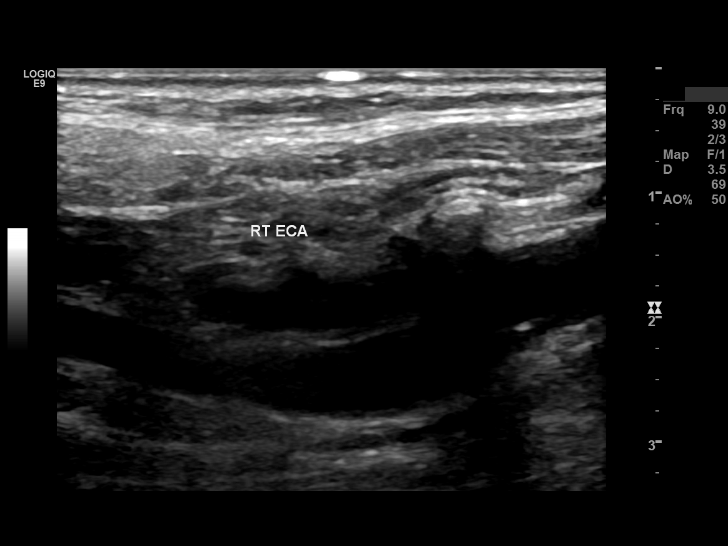
[im 23/52]
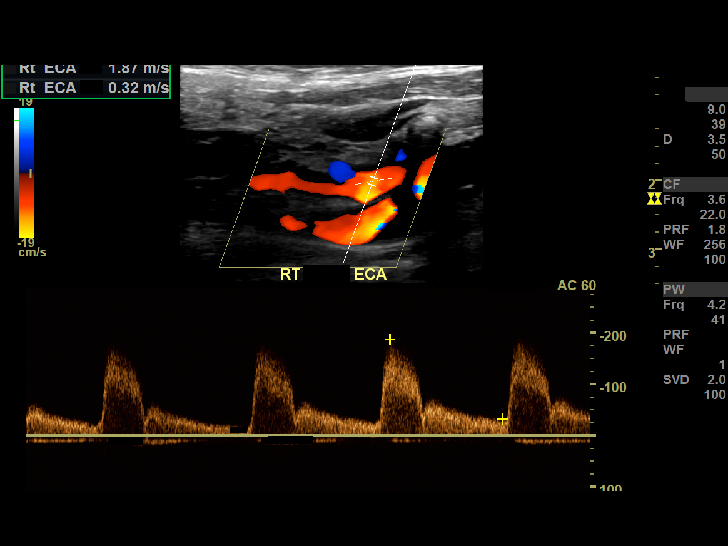
[im 27/52]
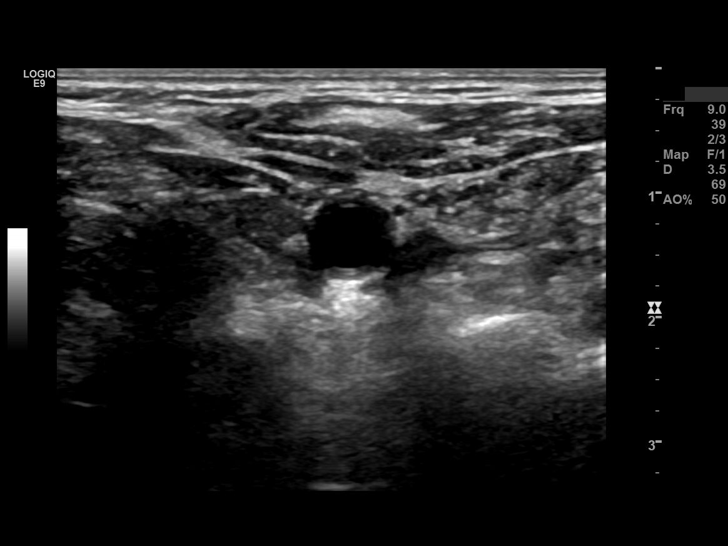
[im 29/52]
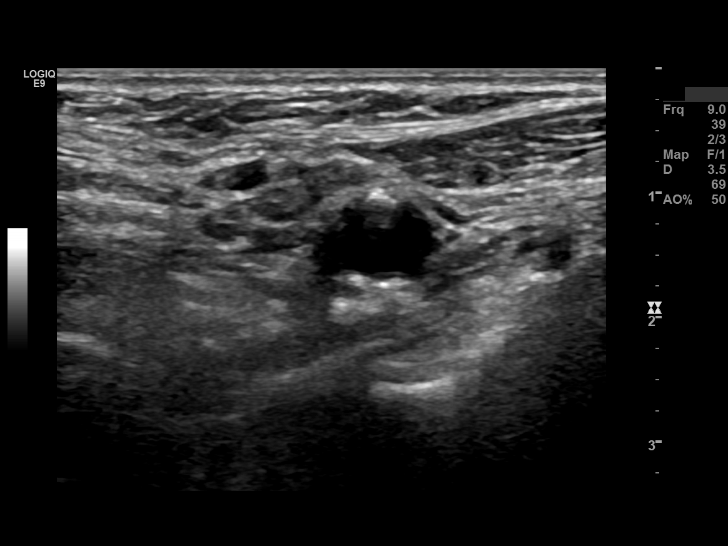
[im 34/52]
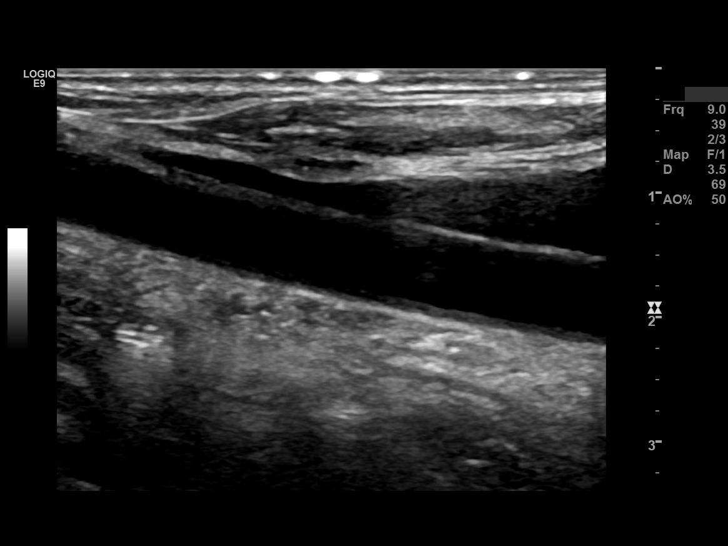
[im 38/52]
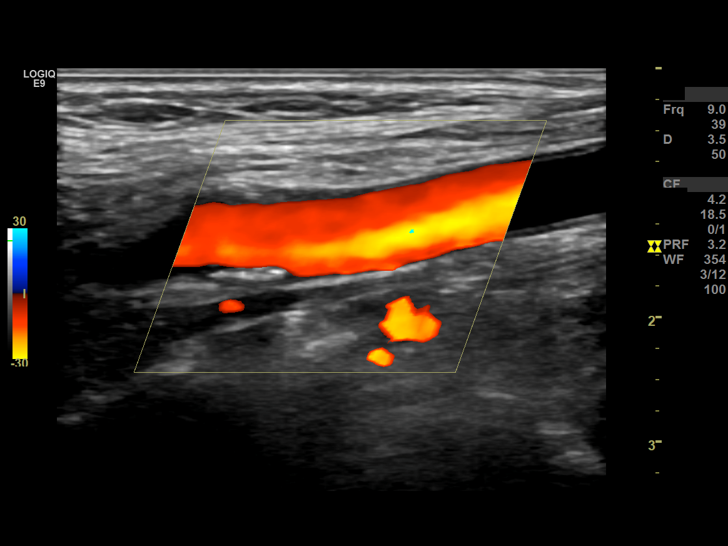
[im 43/52]
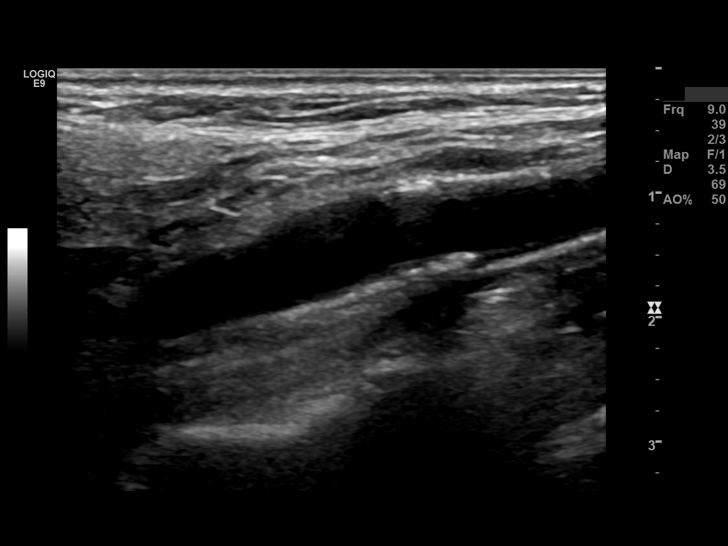
[im 47/52]
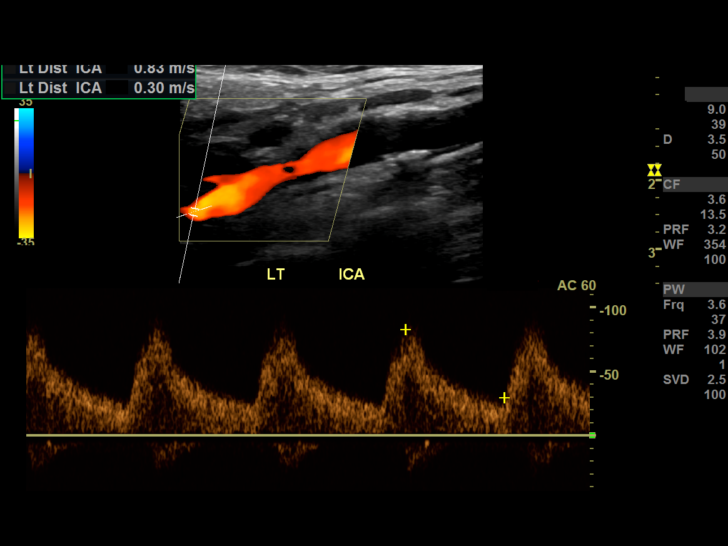
[im 52/52]
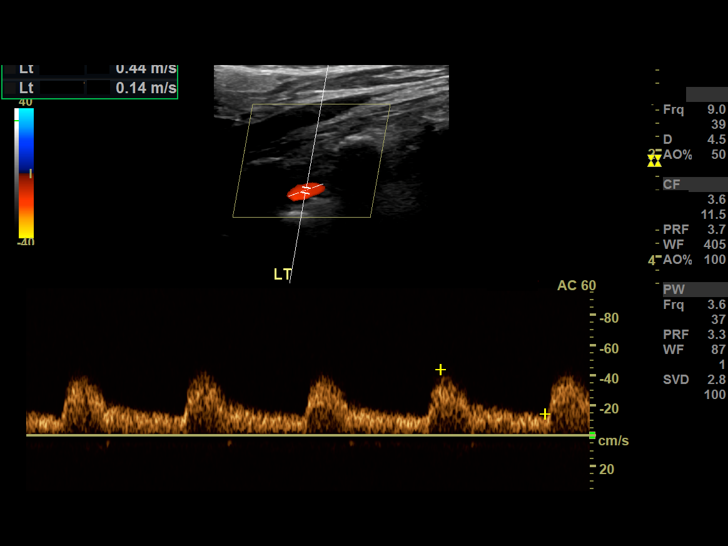

[13 of 24 positions shown; findings below may reference images not displayed]

FINDINGS: Gray-scale soft/calcified carotid plaques:  There are no significant plaques identified in both carotid arteries.
CARDIAC RHYTHM: REGULAR
RIGHT CAROTID ARTERY:
ICA peak systolic velocity: 1.08  m/s
CCA peak systolic velocity: 0.86 m/s
ICA/CCA Ratio:
ICA end-diastolic velocity: 0.25 m/s
ECA velocity:  1.87 m/s
Vertebral artery peak systolic velocity: 0.49 m/s
Vertebral artery direction: ANTEGRADE
LEFT CAROTID ARTERY:
ICA peak systolic velocity: 0.83 m/s
CCA peak systolic velocity: 1.20 m/s
ICA/CCA Ratio:
ICA end-diastolic velocity: 0.30 m/s
ECA velocity:  1.14 m/s
Vertebral artery peak systolic velocity: 0.44 m/s
Vertebral artery direction: ANTEGRADE
IMPRESSION: 1.  CARDIAC RHYTHM: REGULAR
2.  Gray-scale soft/calcified carotid plaques:  There are no significant plaques identified in both carotid arteries.
3.  No hemodynamically significant stenosis of the carotid arteries.
Stenosis velocity criteria are extrapolated from diameter data as defined by the Society of [employer] in Ultrasound Consensus Conference Radiology 3551; 229; [phone]
